# Patient Record
Sex: Male | Born: 1965 | Hispanic: Yes | Marital: Married | Smoking: Never smoker
Health system: Southern US, Community
[De-identification: ages and names within clinical notes are randomized; demographics above are authoritative.]

---

## 2018-12-23 ENCOUNTER — Emergency Department
Admission: EM | Admit: 2018-12-23 | Discharge: 2018-12-23 | Disposition: A | Discharge status: Home / Self Care | Payer: HRSA Program | Attending: Emergency Medicine | Admitting: Emergency Medicine

## 2018-12-23 ENCOUNTER — Other Ambulatory Visit: Payer: Self-pay

## 2018-12-23 ENCOUNTER — Emergency Department: Payer: HRSA Program

## 2018-12-23 DIAGNOSIS — R0602 Shortness of breath: Secondary | ICD-10-CM | POA: Diagnosis present

## 2018-12-23 DIAGNOSIS — U071 COVID-19: Secondary | ICD-10-CM | POA: Insufficient documentation

## 2018-12-23 LAB — SARS CORONAVIRUS 2 BY RT PCR (HOSPITAL ORDER, PERFORMED IN ~~LOC~~ HOSPITAL LAB): SARS Coronavirus 2: POSITIVE — AB

## 2018-12-23 LAB — CBC
HCT: 45.7 % (ref 39.0–52.0)
Hemoglobin: 16 g/dL (ref 13.0–17.0)
MCH: 30.9 pg (ref 26.0–34.0)
MCHC: 35 g/dL (ref 30.0–36.0)
MCV: 88.4 fL (ref 80.0–100.0)
Platelets: 160 10*3/uL (ref 150–400)
RBC: 5.17 MIL/uL (ref 4.22–5.81)
RDW: 11.9 % (ref 11.5–15.5)
WBC: 6.8 10*3/uL (ref 4.0–10.5)
nRBC: 0 % (ref 0.0–0.2)

## 2018-12-23 LAB — COMPREHENSIVE METABOLIC PANEL
ALT: 35 U/L (ref 0–44)
AST: 40 U/L (ref 15–41)
Albumin: 3.2 g/dL — ABNORMAL LOW (ref 3.5–5.0)
Alkaline Phosphatase: 51 U/L (ref 38–126)
Anion gap: 10 (ref 5–15)
BUN: 12 mg/dL (ref 6–20)
CO2: 26 mmol/L (ref 22–32)
Calcium: 8.1 mg/dL — ABNORMAL LOW (ref 8.9–10.3)
Chloride: 98 mmol/L (ref 98–111)
Creatinine, Ser: 0.57 mg/dL — ABNORMAL LOW (ref 0.61–1.24)
GFR calc Af Amer: >60 mL/min (ref >60)
GFR calc non Af Amer: >60 mL/min (ref >60)
Glucose, Bld: 219 mg/dL — ABNORMAL HIGH (ref 70–99)
Potassium: 3.7 mmol/L (ref 3.5–5.1)
Sodium: 134 mmol/L — ABNORMAL LOW (ref 135–145)
Total Bilirubin: 0.4 mg/dL (ref 0.3–1.2)
Total Protein: 6.6 g/dL (ref 6.5–8.1)

## 2018-12-23 LAB — TROPONIN I (HIGH SENSITIVITY): Troponin I (High Sensitivity): 5 ng/L (ref <18)

## 2018-12-23 MED ORDER — ACETAMINOPHEN 500 MG PO TABS
1000.0000 mg | ORAL_TABLET | Freq: Once | ORAL | Status: AC
  Administered 2018-12-23: 1000 mg via ORAL

## 2018-12-23 MED ORDER — ACETAMINOPHEN 500 MG PO TABS
ORAL_TABLET | ORAL | Status: AC
  Filled 2018-12-23: qty 2

## 2018-12-23 MED ORDER — HYDROCOD POLST-CPM POLST ER 10-8 MG/5ML PO SUER: 5.0000 mL | Freq: Two times a day (BID) | ORAL | 0 refills | Status: DC

## 2018-12-23 MED ORDER — SODIUM CHLORIDE 0.9 % IV BOLUS
1000.0000 mL | Freq: Once | INTRAVENOUS | Status: AC
  Administered 2018-12-23: 14:00:00 1000 mL via INTRAVENOUS

## 2018-12-23 NOTE — ED Notes (Signed)
Placed on 2L [place] 95% at this time. Interpreter at bedside for triage.

## 2018-12-23 NOTE — ED Provider Notes (Signed)
[facility] Emergency Department Provider Note  Time seen: 2:36 PM  I have reviewed the triage vital signs and the nursing notes.   HISTORY  Chief Complaint Chest Pain, Fever, Shortness of Breath, and Cough   HPI Clements Toro is a 53 y.o. male with no significant past medical history presents to the emergency department for shortness of breath cough chest pain and fever.  According to the patient last week his wife was sick with similar symptoms however after approximately 4-5 days she recovered.  Approximately 8 days ago the patient developed sore throat cough followed by fever and shortness of breath.  Patient states his symptoms worsen so he came to the emergency department for evaluation.  Patient states chest tightness but states this is mild.  Denies any abdominal pain, vomiting or diarrhea.   Patient's wife was never tested for coronavirus.  History reviewed. No pertinent past medical history.  There are no active problems to display for this patient.   History reviewed. No pertinent surgical history.  Prior to Admission medications   Not on File    No Known Allergies  No family history on file.  Social History Social History   Tobacco Use  . Smoking status: Never Smoker  Substance Use Topics  . Alcohol use: Not Currently  . Drug use: Not on file    Review of Systems Constitutional: Positive for fever ENT: Positive for sore throat Cardiovascular: Positive for chest tightness Respiratory: Positive for shortness of breath.  Positive for cough. Gastrointestinal: Negative for abdominal pain Genitourinary: Negative for urinary compaints Musculoskeletal: Negative for musculoskeletal complaints Skin: Negative for skin complaints  Neurological: Negative for headache All other ROS negative  ____________________________________________   PHYSICAL EXAM:  VITAL SIGNS: ED Triage Vitals [12/23/18 1330]  Enc Vitals Group     BP (!) 167/94      Pulse Rate 95     Resp (!) 28     Temp (!) 103.1 F (39.5 C)     Temp Source Oral     SpO2 91 %     Weight 165 lb (74.8 kg)     Height 5\' 10"  (1.778 m)     Head Circumference      Peak Flow      Pain Score 10     Pain Loc      Pain Edu?      Excl. in [place]?    Constitutional: Alert and oriented. Well appearing and in no distress. Eyes: Normal exam ENT      Head: Normocephalic and atraumatic.      Mouth/Throat: Mucous membranes are moist. Cardiovascular: Normal rate, regular rhythm.  Respiratory: Normal respiratory effort without tachypnea nor retractions. Breath sounds are clear  Gastrointestinal: Soft and nontender. No distention.   Musculoskeletal: Nontender with normal range of motion in all extremities.  Neurologic:  Normal speech and language. No gross focal neurologic deficits  Skin:  Skin is warm, dry and intact.  Psychiatric: Mood and affect are normal.   ____________________________________________   RADIOLOGY  Chest x-ray shows streaky bilateral airspace opacities.  Likely multifocal pneumonia  EKG EKG viewed and interpreted by myself shows a normal sinus rhythm at 94 bpm with a narrow QRS, normal axis, normal intervals, no concerning ST changes.  ____________________________________________   INITIAL IMPRESSION / ASSESSMENT AND PLAN / ED COURSE  Pertinent labs & imaging results that were available during my care of the patient were reviewed by me and considered in my medical decision making (  see chart for details).   Patient presents emergency department for cough shortness of breath and fever.  Wife was sick with similar symptoms little over 1 week ago.  He denies any other known sick contacts and states they isolated away from each other in the home.  Differential would include coronavirus, pneumonia, upper respiratory infection, bronchitis.  Chest x-ray consistent with multifocal pneumonia, coronavirus test pending.  Graciella BeltonGregorio Pizzi was evaluated in  Emergency Department on 12/23/2018 for the symptoms described in the history of present illness. He was evaluated in the context of the global COVID-19 pandemic, which necessitated consideration that the patient might be at risk for infection with the SARS-CoV-2 virus that causes COVID-19. Institutional protocols and algorithms that pertain to the evaluation of patients at risk for COVID-19 are in a state of rapid change based on information released by regulatory bodies including the CDC and federal and state organizations. These policies and algorithms were followed during the patient's care in the ED.  ____________________________________________   FINAL CLINICAL IMPRESSION(S) / ED Kayleen MemosIAGNOSES     Lizbet Cirrincione, MD 12/25/18 [phone]

## 2018-12-23 NOTE — ED Notes (Signed)
Report given to Amy C. RN

## 2018-12-23 NOTE — ED Notes (Signed)
md at bedside.  Iv in place.  Pt alert.  Pt has a cough.  nsr on monitor.  Pt reports sob.  No chest pain now.

## 2018-12-23 NOTE — ED Triage Notes (Addendum)
Fever, cough, SOB and chest pain x 7 days.

## 2018-12-23 NOTE — ED Provider Notes (Signed)
Patient is coronavirus positive, oxygen saturation appears to be 92% off of oxygen and he is feeling better.  He is cleared for outpatient follow-up.   Earleen Newport, MD 12/23/18 [phone]

## 2018-12-23 NOTE — ED Notes (Signed)
md in with pt again  D/c inst to pt.   Pt alert.

## 2018-12-23 NOTE — ED Notes (Signed)
Resumed care from sonja rn.  Pt sitting up on stretcher.

## 2018-12-27 ENCOUNTER — Inpatient Hospital Stay (HOSPITAL_COMMUNITY)
Admission: AD | Admit: 2018-12-27 | Discharge: 2019-01-02 | DRG: 177 | Disposition: A | Discharge status: Home / Self Care | Payer: HRSA Program | Attending: Family Medicine | Admitting: Family Medicine

## 2018-12-27 ENCOUNTER — Emergency Department: Payer: HRSA Program

## 2018-12-27 ENCOUNTER — Emergency Department
Admission: EM | Admit: 2018-12-27 | Discharge: 2018-12-27 | Disposition: A | Discharge status: Other Acute Inpatient Hospital | Payer: HRSA Program | Attending: Emergency Medicine | Admitting: Emergency Medicine

## 2018-12-27 ENCOUNTER — Encounter: Payer: Self-pay | Admitting: Emergency Medicine

## 2018-12-27 ENCOUNTER — Other Ambulatory Visit: Payer: Self-pay

## 2018-12-27 DIAGNOSIS — U071 COVID-19: Secondary | ICD-10-CM

## 2018-12-27 DIAGNOSIS — E785 Hyperlipidemia, unspecified: Secondary | ICD-10-CM | POA: Diagnosis present

## 2018-12-27 DIAGNOSIS — R739 Hyperglycemia, unspecified: Secondary | ICD-10-CM | POA: Diagnosis present

## 2018-12-27 DIAGNOSIS — E871 Hypo-osmolality and hyponatremia: Secondary | ICD-10-CM

## 2018-12-27 DIAGNOSIS — J9601 Acute respiratory failure with hypoxia: Secondary | ICD-10-CM | POA: Diagnosis present

## 2018-12-27 DIAGNOSIS — J1289 Other viral pneumonia: Secondary | ICD-10-CM | POA: Diagnosis present

## 2018-12-27 DIAGNOSIS — E1165 Type 2 diabetes mellitus with hyperglycemia: Secondary | ICD-10-CM | POA: Diagnosis not present

## 2018-12-27 DIAGNOSIS — E119 Type 2 diabetes mellitus without complications: Secondary | ICD-10-CM | POA: Diagnosis not present

## 2018-12-27 DIAGNOSIS — Z79899 Other long term (current) drug therapy: Secondary | ICD-10-CM

## 2018-12-27 DIAGNOSIS — R0602 Shortness of breath: Secondary | ICD-10-CM | POA: Diagnosis present

## 2018-12-27 DIAGNOSIS — E118 Type 2 diabetes mellitus with unspecified complications: Secondary | ICD-10-CM | POA: Diagnosis not present

## 2018-12-27 DIAGNOSIS — J069 Acute upper respiratory infection, unspecified: Secondary | ICD-10-CM | POA: Diagnosis not present

## 2018-12-27 DIAGNOSIS — Z87891 Personal history of nicotine dependence: Secondary | ICD-10-CM | POA: Diagnosis not present

## 2018-12-27 DIAGNOSIS — E861 Hypovolemia: Secondary | ICD-10-CM | POA: Diagnosis present

## 2018-12-27 DIAGNOSIS — R740 Nonspecific elevation of levels of transaminase and lactic acid dehydrogenase [LDH]: Secondary | ICD-10-CM | POA: Diagnosis present

## 2018-12-27 DIAGNOSIS — R0902 Hypoxemia: Secondary | ICD-10-CM

## 2018-12-27 DIAGNOSIS — Z9981 Dependence on supplemental oxygen: Secondary | ICD-10-CM

## 2018-12-27 DIAGNOSIS — R0603 Acute respiratory distress: Secondary | ICD-10-CM | POA: Diagnosis not present

## 2018-12-27 DIAGNOSIS — IMO0002 Reserved for concepts with insufficient information to code with codable children: Secondary | ICD-10-CM | POA: Diagnosis present

## 2018-12-27 LAB — URINALYSIS, COMPLETE (UACMP) WITH MICROSCOPIC
Bacteria, UA: NONE SEEN
Bilirubin Urine: NEGATIVE
Glucose, UA: >=500 mg/dL — AB
Hgb urine dipstick: NEGATIVE
Ketones, ur: 20 mg/dL — AB
Nitrite: NEGATIVE
Protein, ur: 30 mg/dL — AB
Specific Gravity, Urine: 1.009 (ref 1.005–1.030)
pH: 7 (ref 5.0–8.0)

## 2018-12-27 LAB — CBC WITH DIFFERENTIAL/PLATELET
Abs Immature Granulocytes: 0.17 10*3/uL — ABNORMAL HIGH (ref 0.00–0.07)
Basophils Absolute: 0 10*3/uL (ref 0.0–0.1)
Basophils Relative: 0 %
Eosinophils Absolute: 0 10*3/uL (ref 0.0–0.5)
Eosinophils Relative: 0 %
HCT: 46.7 % (ref 39.0–52.0)
Hemoglobin: 16.4 g/dL (ref 13.0–17.0)
Immature Granulocytes: 1 %
Lymphocytes Relative: 9 %
Lymphs Abs: 1.1 10*3/uL (ref 0.7–4.0)
MCH: 30.8 pg (ref 26.0–34.0)
MCHC: 35.1 g/dL (ref 30.0–36.0)
MCV: 87.6 fL (ref 80.0–100.0)
Monocytes Absolute: 0.8 10*3/uL (ref 0.1–1.0)
Monocytes Relative: 7 %
Neutro Abs: 9.7 10*3/uL — ABNORMAL HIGH (ref 1.7–7.7)
Neutrophils Relative %: 83 %
Platelets: 266 10*3/uL (ref 150–400)
RBC: 5.33 MIL/uL (ref 4.22–5.81)
RDW: 11.9 % (ref 11.5–15.5)
WBC: 11.8 10*3/uL — ABNORMAL HIGH (ref 4.0–10.5)
nRBC: 0 % (ref 0.0–0.2)

## 2018-12-27 LAB — PROCALCITONIN: Procalcitonin: 0.44 ng/mL

## 2018-12-27 LAB — COMPREHENSIVE METABOLIC PANEL
ALT: 42 U/L (ref 0–44)
AST: 51 U/L — ABNORMAL HIGH (ref 15–41)
Albumin: 3 g/dL — ABNORMAL LOW (ref 3.5–5.0)
Alkaline Phosphatase: 62 U/L (ref 38–126)
Anion gap: 14 (ref 5–15)
BUN: 11 mg/dL (ref 6–20)
CO2: 24 mmol/L (ref 22–32)
Calcium: 8.2 mg/dL — ABNORMAL LOW (ref 8.9–10.3)
Chloride: 92 mmol/L — ABNORMAL LOW (ref 98–111)
Creatinine, Ser: 0.54 mg/dL — ABNORMAL LOW (ref 0.61–1.24)
GFR calc Af Amer: >60 mL/min (ref >60)
GFR calc non Af Amer: >60 mL/min (ref >60)
Glucose, Bld: 200 mg/dL — ABNORMAL HIGH (ref 70–99)
Potassium: 3.8 mmol/L (ref 3.5–5.1)
Sodium: 130 mmol/L — ABNORMAL LOW (ref 135–145)
Total Bilirubin: 1 mg/dL (ref 0.3–1.2)
Total Protein: 6.9 g/dL (ref 6.5–8.1)

## 2018-12-27 LAB — FERRITIN: Ferritin: 1720 ng/mL — ABNORMAL HIGH (ref 24–336)

## 2018-12-27 LAB — LACTIC ACID, PLASMA: Lactic Acid, Venous: 1.5 mmol/L (ref 0.5–1.9)

## 2018-12-27 LAB — GLUCOSE, CAPILLARY
Glucose-Capillary: 229 mg/dL — ABNORMAL HIGH (ref 70–99)
Glucose-Capillary: 217 mg/dL — ABNORMAL HIGH (ref 70–99)

## 2018-12-27 LAB — BRAIN NATRIURETIC PEPTIDE: B Natriuretic Peptide: 52 pg/mL (ref 0.0–100.0)

## 2018-12-27 LAB — TROPONIN I (HIGH SENSITIVITY)
Troponin I (High Sensitivity): 5 ng/L (ref <18)
Troponin I (High Sensitivity): 4 ng/L (ref <18)

## 2018-12-27 LAB — C-REACTIVE PROTEIN: CRP: 23.3 mg/dL — ABNORMAL HIGH (ref <1.0)

## 2018-12-27 LAB — FIBRIN DERIVATIVES D-DIMER (ARMC ONLY): Fibrin derivatives D-dimer (ARMC): 813.96 ng/mL (FEU) — ABNORMAL HIGH (ref 0.00–499.00)

## 2018-12-27 MED ORDER — ONDANSETRON HCL 4 MG/2ML IJ SOLN: 4.0000 mg | Freq: Four times a day (QID) | INTRAMUSCULAR | Status: DC | PRN

## 2018-12-27 MED ORDER — INSULIN ASPART 100 UNIT/ML ~~LOC~~ SOLN
0.0000 [IU] | SUBCUTANEOUS | Status: DC
  Administered 2018-12-27 – 2018-12-28 (×5): 5 [IU] via SUBCUTANEOUS
  Administered 2018-12-28: 11 [IU] via SUBCUTANEOUS
  Administered 2018-12-28: 08:00:00 3 [IU] via SUBCUTANEOUS
  Administered 2018-12-28: 21:00:00 8 [IU] via SUBCUTANEOUS
  Administered 2018-12-28 – 2018-12-29 (×3): 3 [IU] via SUBCUTANEOUS
  Administered 2018-12-29: 13:00:00 5 [IU] via SUBCUTANEOUS

## 2018-12-27 MED ORDER — SODIUM CHLORIDE 0.9 % IV SOLN
INTRAVENOUS | Status: DC
  Administered 2018-12-27: 18:00:00 75 mL/h via INTRAVENOUS

## 2018-12-27 MED ORDER — SODIUM CHLORIDE 0.9 % IV SOLN
1.0000 g | Freq: Once | INTRAVENOUS | Status: AC
  Administered 2018-12-27: 10:00:00 1 g via INTRAVENOUS
  Filled 2018-12-27: qty 10

## 2018-12-27 MED ORDER — ONDANSETRON HCL 4 MG PO TABS: 4.0000 mg | ORAL_TABLET | Freq: Four times a day (QID) | ORAL | Status: DC | PRN

## 2018-12-27 MED ORDER — SODIUM CHLORIDE 0.9 % IV SOLN
500.0000 mg | Freq: Once | INTRAVENOUS | Status: AC
  Administered 2018-12-27: 10:00:00 500 mg via INTRAVENOUS
  Filled 2018-12-27: qty 500

## 2018-12-27 MED ORDER — SODIUM CHLORIDE 0.9% FLUSH
3.0000 mL | Freq: Two times a day (BID) | INTRAVENOUS | Status: DC
  Administered 2018-12-27 – 2019-01-02 (×11): 3 mL via INTRAVENOUS

## 2018-12-27 MED ORDER — DOCUSATE SODIUM 100 MG PO CAPS
100.0000 mg | ORAL_CAPSULE | Freq: Two times a day (BID) | ORAL | Status: DC
  Administered 2018-12-27 – 2019-01-02 (×11): 100 mg via ORAL
  Filled 2018-12-27 (×12): qty 1

## 2018-12-27 MED ORDER — FAMOTIDINE 20 MG PO TABS
20.0000 mg | ORAL_TABLET | Freq: Two times a day (BID) | ORAL | Status: DC
  Administered 2018-12-27 – 2019-01-02 (×12): 20 mg via ORAL
  Filled 2018-12-27 (×12): qty 1

## 2018-12-27 MED ORDER — METHYLPREDNISOLONE SODIUM SUCC 125 MG IJ SOLR
125.0000 mg | Freq: Once | INTRAMUSCULAR | Status: AC
  Administered 2018-12-27: 125 mg via INTRAVENOUS
  Filled 2018-12-27: qty 2

## 2018-12-27 MED ORDER — SODIUM CHLORIDE 0.9% FLUSH: 3.0000 mL | INTRAVENOUS | Status: DC | PRN

## 2018-12-27 MED ORDER — VITAMIN C 500 MG PO TABS
500.0000 mg | ORAL_TABLET | Freq: Every day | ORAL | Status: DC
  Administered 2018-12-27 – 2019-01-02 (×7): 500 mg via ORAL
  Filled 2018-12-27 (×8): qty 1

## 2018-12-27 MED ORDER — SODIUM CHLORIDE 0.9 % IV SOLN
100.0000 mg | INTRAVENOUS | Status: AC
  Administered 2018-12-28 – 2018-12-31 (×4): 100 mg via INTRAVENOUS
  Filled 2018-12-27 (×4): qty 20

## 2018-12-27 MED ORDER — GUAIFENESIN-DM 100-10 MG/5ML PO SYRP
10.0000 mL | ORAL_SOLUTION | ORAL | Status: DC | PRN
  Administered 2018-12-31: 16:00:00 10 mL via ORAL
  Filled 2018-12-27: qty 10

## 2018-12-27 MED ORDER — SODIUM CHLORIDE 0.9 % IV SOLN: 250.0000 mL | INTRAVENOUS | Status: DC | PRN

## 2018-12-27 MED ORDER — HYDROCOD POLST-CPM POLST ER 10-8 MG/5ML PO SUER
5.0000 mL | Freq: Two times a day (BID) | ORAL | Status: DC | PRN
  Administered 2018-12-30 – 2018-12-31 (×2): 5 mL via ORAL
  Filled 2018-12-27 (×2): qty 5

## 2018-12-27 MED ORDER — ENOXAPARIN SODIUM 40 MG/0.4ML ~~LOC~~ SOLN
40.0000 mg | Freq: Two times a day (BID) | SUBCUTANEOUS | Status: DC
  Administered 2018-12-27 – 2019-01-01 (×10): 40 mg via SUBCUTANEOUS
  Filled 2018-12-27 (×9): qty 0.4

## 2018-12-27 MED ORDER — SODIUM CHLORIDE 0.9 % IV SOLN
200.0000 mg | Freq: Once | INTRAVENOUS | Status: AC
  Administered 2018-12-27: 20:00:00 200 mg via INTRAVENOUS
  Filled 2018-12-27: qty 40

## 2018-12-27 MED ORDER — ORAL CARE MOUTH RINSE
15.0000 mL | Freq: Two times a day (BID) | OROMUCOSAL | Status: DC
  Administered 2018-12-27 – 2019-01-02 (×9): 15 mL via OROMUCOSAL

## 2018-12-27 MED ORDER — METHYLPREDNISOLONE SODIUM SUCC 40 MG IJ SOLR
40.0000 mg | Freq: Two times a day (BID) | INTRAMUSCULAR | Status: DC
  Administered 2018-12-27 – 2019-01-01 (×10): 40 mg via INTRAVENOUS
  Filled 2018-12-27 (×9): qty 1

## 2018-12-27 MED ORDER — ACETAMINOPHEN 325 MG PO TABS: 650.0000 mg | ORAL_TABLET | Freq: Four times a day (QID) | ORAL | Status: DC | PRN

## 2018-12-27 MED ORDER — ZINC SULFATE 220 (50 ZN) MG PO CAPS
220.0000 mg | ORAL_CAPSULE | Freq: Every day | ORAL | Status: DC
  Administered 2018-12-27 – 2019-01-02 (×7): 220 mg via ORAL
  Filled 2018-12-27 (×7): qty 1

## 2018-12-27 MED ORDER — TOCILIZUMAB 400 MG/20ML IV SOLN
8.0000 mg/kg | Freq: Once | INTRAVENOUS | Status: AC
  Administered 2018-12-27: 598 mg via INTRAVENOUS
  Filled 2018-12-27: qty 29.9

## 2018-12-27 MED ORDER — IPRATROPIUM-ALBUTEROL 20-100 MCG/ACT IN AERS
1.0000 | INHALATION_SPRAY | Freq: Four times a day (QID) | RESPIRATORY_TRACT | Status: DC
  Administered 2018-12-27 – 2018-12-29 (×6): 1 via RESPIRATORY_TRACT
  Filled 2018-12-27: qty 4

## 2018-12-27 MED ORDER — CHLORHEXIDINE GLUCONATE CLOTH 2 % EX PADS
6.0000 | MEDICATED_PAD | Freq: Every day | CUTANEOUS | Status: DC
  Administered 2018-12-28 – 2019-01-01 (×5): 6 via TOPICAL

## 2018-12-27 MED ORDER — SODIUM CHLORIDE 0.9 % IV SOLN
25.0000 mg | Freq: Three times a day (TID) | INTRAVENOUS | Status: DC | PRN
  Administered 2018-12-27 – 2018-12-28 (×3): 25 mg via INTRAVENOUS
  Filled 2018-12-27 (×3): qty 1

## 2018-12-27 NOTE — ED Provider Notes (Signed)
[facility] Emergency Department Provider Note   ____________________________________________   First MD Initiated Contact with Patient 12/27/18 [phone]     (approximate)  I have reviewed the triage vital signs and the nursing notes.   HISTORY  Chief Complaint Respiratory Distress History limited by shortness of breath Chief complaint shortness of breath HPI Chad Mcclain is a 53 y.o. male who is known COVID positive.  He has been getting more short of breath for the last few days.  He has a low-grade fever.  And triage his sats were in the 56s.  He was brought straight back.  He is extremely short of breath before he is put on oxygen.        History reviewed. No pertinent past medical history.  There are no active problems to display for this patient.   History reviewed. No pertinent surgical history.  Prior to Admission medications   Medication Sig Start Date End Date Taking? Authorizing Provider  chlorpheniramine-HYDROcodone (TUSSIONEX PENNKINETIC ER) 10-8 MG/5ML SUER Take 5 mLs by mouth 2 (two) times daily. 12/23/18   Earleen Newport, MD    Allergies Patient has no known allergies.  No family history on file.  Social History Social History   Tobacco Use  . Smoking status: Never Smoker  . Smokeless tobacco: Never Used  Substance Use Topics  . Alcohol use: Not Currently  . Drug use: Not on file    Review of Systems  Unable to get complete review of systems due to shortness of breath  ____________________________________________   PHYSICAL EXAM:  VITAL SIGNS: ED Triage Vitals [12/27/18 0845]  Enc Vitals Group     BP      Pulse Rate 82     Resp (!) 28     Temp 99.6 F (37.6 C)     Temp Source Oral     SpO2 (!) 85 %     Weight      Height      Head Circumference      Peak Flow      Pain Score 0     Pain Loc      Pain Edu?      Excl. in [place]?     Constitutional: Alert and oriented.  In respiratory distress Eyes:  Conjunctivae are normal.  Head: Atraumatic. Nose: No congestion/rhinnorhea. Mouth/Throat: Mucous membranes are moist.  Oropharynx non-erythematous. Neck: No stridor.   Cardiovascular: Normal rate, regular rhythm. Grossly normal heart sounds.  Good peripheral circulation. Respiratory: Increased respiratory effort.   retractions. Lungs CTAB. Gastrointestinal: Soft and nontender. No distention. No abdominal bruits. No CVA tenderness. Musculoskeletal: No lower extremity tenderness nor edema   Neurologic:  Normal speech and language. No gross focal neurologic deficits are appreciated.  Skin:  Skin is warm, dry and intact. No rash noted.   ____________________________________________   LABS (all labs ordered are listed, but only abnormal results are displayed)  Labs Reviewed  COMPREHENSIVE METABOLIC PANEL - Abnormal; Notable for the following components:      Result Value   Sodium 130 (*)    Chloride 92 (*)    Glucose, Bld 200 (*)    Creatinine, Ser 0.54 (*)    Calcium 8.2 (*)    Albumin 3.0 (*)    AST 51 (*)    All other components within normal limits  CBC WITH DIFFERENTIAL/PLATELET - Abnormal; Notable for the following components:   WBC 11.8 (*)    Neutro Abs 9.7 (*)  Abs Immature Granulocytes 0.17 (*)    All other components within normal limits  CULTURE, BLOOD (ROUTINE X 2)  CULTURE, BLOOD (ROUTINE X 2)  URINE CULTURE  BRAIN NATRIURETIC PEPTIDE  LACTIC ACID, PLASMA  LACTIC ACID, PLASMA  URINALYSIS, COMPLETE (UACMP) WITH MICROSCOPIC  TROPONIN I (HIGH SENSITIVITY)   ____________________________________________  EKG   ____________________________________________  RADIOLOGY  ED MD interpretation:    Official radiology report(s): Dg Chest Portable 1 View  Result Date: 12/27/2018 CLINICAL DATA:  COVID-19 positive.  Shortness of breath. EXAM: PORTABLE CHEST 1 VIEW COMPARISON:  12/23/2018 FINDINGS: Again noted are low lung volumes. Persistent streaky lung densities  with slight progression, particularly at the right lung base. Heart size is stable and within normal limits. The trachea is midline. Negative for pneumothorax. IMPRESSION: Mild progression of the bilateral lung densities particularly at the right lung base. Findings are suggestive for bilateral pneumonia. Electronically Signed   By: Richarda OverlieAdam  Henn M.D.   On: 12/27/2018 09:09    ____________________________________________   PROCEDURES  Procedure(s) performed (including Critical Care):  Procedures   ____________________________________________   INITIAL IMPRESSION / ASSESSMENT AND PLAN / ED COURSE Patient was coronavirus positive on the sixth of this month.  He is now getting increasing shortness of breath and worsening chest x-ray.  Troponin is negative BNP is negative lactic acid is negative white count is minimally elevated.  I will give him some antibiotics for regular pneumonia just in case but we will see if we can get [facility].  .[facility] but [facility] are [facility] yet.  He is stable on his nonrebreather.  O2 sats about 95 to 96%.  We have ordered the labs and given the medications requested by [facility].  He will be transferred to since they have a room over there.             ____________________________________________   FINAL CLINICAL IMPRESSION(S) / ED DIAGNOSES  Final diagnoses:  COVID-19 virus infection  Respiratory distress  Hypoxia     ED Discharge Orders    None       Note:  This document was prepared using Dragon voice recognition software and may include unintentional dictation errors.    Arnaldo NatalMalinda, [name], MD 12/27/18 [phone]

## 2018-12-27 NOTE — H&P (Addendum)
[facility] History and Physical  Chad Mcclain URK:[id number] DOB: December 17, 1965 DOA: 12/27/2018   PCP: Patient, No Pcp Per  Specialists: None  Chief Complaint: Shortness of breath  HPI: Chad Mcclain is a 53 y.o. male with no known past medical history who presented with complains of shortness of breath and cough.  Initially presented to the emergency department at [facility] on 7/6 complaining of fever, cough, shortness of breath ongoing for many days.  Patient was tested for COVID-19 and was found to be positive.  However at that time patient was not hypoxic.  He was discharged home.  He presented back to the emergency department this morning with complaints of worsening shortness of breath.  Denies orthopnea or symptoms suggestive of PND.  Patient mentions a cough with whitish expectoration.  Denies any chest pain.  No nausea vomiting.  No diarrhea.  He has had body aches.  No known sick contacts.    In the emergency department he was noted to be hypoxic saturating in the 60s.  Oxygen was immediately applied.  He improved.  He was given steroids.  However patient continued to have oxygen requirements and felt short of breath.  He was changed over to 100% nonrebreather.  Chest x-ray showed bilateral opacities.  Patient was hospitalized for further management.  Home Medications: Prior to Admission medications   Medication Sig Start Date End Date Taking? Authorizing Provider  chlorpheniramine-HYDROcodone (TUSSIONEX PENNKINETIC ER) 10-8 MG/5ML SUER Take 5 mLs by mouth 2 (two) times daily. 12/23/18   Chad Newport, MD    Allergies: No Known Allergies  Past Medical History: Denies any past medical history.  No history of heart disease.  Social History: He is a former smoker.  Denies any alcohol use.  No illicit drug use.   Family History: Denies any health problems in his family members.  However his parents are deceased.  Review of Systems - History obtained from the  patient General ROS: positive for  - fatigue and fever Psychological ROS: negative Ophthalmic ROS: negative ENT ROS: negative Allergy and Immunology ROS: negative Hematological and Lymphatic ROS: negative Endocrine ROS: negative Respiratory ROS: As in HPI Cardiovascular ROS: As in HPI Gastrointestinal ROS: no abdominal pain, change in bowel habits, or black or bloody stools Genito-Urinary ROS: no dysuria, trouble voiding, or hematuria Musculoskeletal ROS: negative Neurological ROS: no TIA or stroke symptoms Dermatological ROS: negative  Physical Examination  Vitals:   12/27/18 1617 12/27/18 1618  BP:  (!) 141/89  Pulse: 75 74  Resp: (!) 21 (!) 35  TempSrc:  Oral  SpO2: 92% 92%    BP (!) 141/89 (BP Location: Left Arm)   Pulse 74   Resp (!) 35   SpO2 92%   General appearance: alert, cooperative, appears stated age and no distress Head: Normocephalic, without obvious abnormality, atraumatic Eyes: conjunctivae/corneas clear. PERRL, EOM's intact. Throat: lips, mucosa, and tongue normal; teeth and gums normal Neck: no adenopathy, no carotid bruit, no JVD, supple, symmetrical, trachea midline and thyroid not enlarged, symmetric, no tenderness/mass/nodules Resp: Tachypneic at rest.  No use of accessory muscles yet.  Crackles bilaterally right more than left.  No wheezing or rhonchi. Cardio: regular rate and rhythm, S1, S2 normal, no murmur, click, rub or gallop GI: soft, non-tender; bowel sounds normal; no masses,  no organomegaly Extremities: extremities normal, atraumatic, no cyanosis or edema Pulses: 2+ and symmetric Skin: Skin color, texture, turgor normal. No rashes or lesions Lymph nodes: Cervical, supraclavicular, and axillary nodes normal. Neurologic: Alert and  oriented x3.  Cranial nerves II to XII intact.  No focal motor neurological deficits.    Labs on Admission: I have personally reviewed following labs and imaging studies  CBC: Recent Labs  Lab 12/23/18 1405  12/27/18 0856  WBC 6.8 11.8*  NEUTROABS  --  9.7*  HGB 16.0 16.4  HCT 45.7 46.7  MCV 88.4 87.6  PLT 160 266   Basic Metabolic Panel: Recent Labs  Lab 12/23/18 1405 12/27/18 0856  NA 134* 130*  K 3.7 3.8  CL 98 92*  CO2 26 24  GLUCOSE 219* 200*  BUN 12 11  CREATININE 0.57* 0.54*  CALCIUM 8.1* 8.2*   GFR: Estimated Creatinine Clearance: 107.7 mL/min (A) (by C-G formula based on SCr of 0.54 mg/dL (L)). Liver Function Tests: Recent Labs  Lab 12/23/18 1405 12/27/18 0856  AST 40 51*  ALT 35 42  ALKPHOS 51 62  BILITOT 0.4 1.0  PROT 6.6 6.9  ALBUMIN 3.2* 3.0*   Anemia Panel: Recent Labs    12/27/18 0856  FERRITIN 1,720*     Radiological Exams on Admission: Dg Chest Portable 1 View  Result Date: 12/27/2018 CLINICAL DATA:  COVID-19 positive.  Shortness of breath. EXAM: PORTABLE CHEST 1 VIEW COMPARISON:  12/23/2018 FINDINGS: Again noted are low lung volumes. Persistent streaky lung densities with slight progression, particularly at the right lung base. Heart size is stable and within normal limits. The trachea is midline. Negative for pneumothorax. IMPRESSION: Mild progression of the bilateral lung densities particularly at the right lung base. Findings are suggestive for bilateral pneumonia. Electronically Signed   By: Richarda OverlieAdam  Henn M.D.   On: 12/27/2018 09:09    My interpretation of Electrocardiogram: EKG from 7/6 shows sinus rhythm.  Left axis deviation.  Nonspecific T wave changes.  No concerning ST changes.  Intervals appear to be normal.  QT interval was normal.   Problem List  Active Problems:   Acute respiratory disease due to COVID-19 virus   Acute respiratory failure with hypoxia (HCC)   Hyperglycemia   Hyponatremia   Assessment: This is a 53 year old male of Hispanic origin who speaks very limited English presented with many day history of shortness of breath and cough.  He was found to be positive for COVID-19 on 7/6 but at that time he was not hypoxic.   Today he presented with worsening shortness of breath and has acute respiratory failure with hypoxia.  This is all secondary to COVID-19.  Plan:  Acute respiratory failure with hypoxia/pneumonia secondary to COVID-19  COVID-19 Labs  Recent Labs    12/27/18 0856  FERRITIN 1,720*  CRP 23.3*    Lab Results  Component Value Date   SARSCOV2NAA POSITIVE (A) 12/23/2018     Fever: Noted to be afebrile today.  He did have a temperature of 103.1 on 7/6. Oxygen requirements: Currently on high flow nasal cannula at 15 L/min.  Saturating in the 90s Antibiotics: He was given ceftriaxone and azithromycin in the [place] emergency department Remdesivir: This will be initiated today Steroids: He was given Solu-Medrol in the emergency department.  He will be started on 40 mg twice daily Diuretics: Not yet Actemra: He was given 1 dose earlier today at [facility]. Convalescent Plasma: Not yet Vitamin C and Zinc: Will be initiated DVT Prophylaxis:  Lovenox 40 mg subcutaneously twice daily  Patient requiring about 15 L of oxygen by high flow nasal cannula.  Chest x-ray does show bilateral opacities.  His inflammatory markers elevated with a CRP  of 23.3 and a ferritin of 1720.  Patient has been given steroids and Actemra.  He will be started on Remdesivir as well.  He has normal renal function.  His LFTs are close to normal.  He will be monitored in the intensive care unit for now.  Prone positioning was encouraged.  Incentive spirometry and mobilization.  We will continue to trend inflammatory markers and d-dimer.  Patient's BNP is normal.  Procalcitonin was noted to be 0.45.  Patient was given ceftriaxone and [facility]ithromycin at [facility].  We will recheck procalcitonin levels tomorrow.  WBC is noted to be mildly elevated 11.8.  Decide on continuing antibiotics depending on these values and clinical status tomorrow morning.  The treatment plan and use of medications and known side effects were  discussed with patient.  Some of the medications used are based on case reports/anecdotal data which are not peer-reviewed and has not been studied using randomized control trials.  Complete risks and long-term side effects are unknown, however in the best clinical judgment they seem to be of some benefit.  Patient agrees with the treatment plan and want to receive these treatments as indicated.  He was given Actemra in [facility].  He will be placed on steroids.  Hyponatremia Possibly due to hypovolemia.  We will recheck labs tomorrow.  Could consider gentle hydration for the next 12 hours.  Hyperglycemia Patient without any known history of diabetes.  His glucose level is 200 from blood work done this morning.  We will check HbA1c.  Monitor his CBGs as glucose levels will most likely rise due to steroids.  Place him on SSI for now.  It is quite possible that he has undiagnosed diabetes.  Hiccups Will use Thorazine as needed.  DVT Prophylaxis: Lovenox Code Status: Full code Family Communication: Discussed with the patient Disposition: Intensive care unit Consults called: Pulmonology Admission Status: Inpatient  Severity of Illness: The appropriate patient status for this patient is INPATIENT. Inpatient status is judged to be reasonable and necessary in order to provide the required intensity of service to ensure the patient's safety. The patient's presenting symptoms, physical exam findings, and initial radiographic and laboratory data in the context of their chronic comorbidities is felt to place them at high risk for further clinical deterioration. Furthermore, it is not anticipated that the patient will be medically stable for discharge from the hospital within 2 midnights of admission. The following factors support the patient status of inpatient.   " The patient's presenting symptoms include shortness of breath and cough. " The worrisome physical exam findings  include tachypnea, hypoxia. " The initial radiographic and laboratory data are worrisome because of bilateral lung opacities. " The chronic co-morbidities include former smoker.   * I certify that at the point of admission it is my clinical judgment that the patient will require inpatient hospital care spanning beyond 2 midnights from the point of admission due to high intensity of service, high risk for further deterioration and high frequency of surveillance required.*  Further management decisions will depend on results of further testing and patient's response to treatment.   Carlson Belland OmnicareKrishnan  [facility] Pager on [email or web address]  12/27/2018, 5:28 PM

## 2018-12-27 NOTE — ED Notes (Addendum)
Pt ambulated to bathroom without assistance, while in the bathroom pt's O2 dropped to 74%, placed back on 15L NRB, O2 back to 96% at this time

## 2018-12-27 NOTE — ED Notes (Signed)
[facility]  CALLED  FOR  TRANSFER PER  DR  Cinda Quest  MD

## 2018-12-27 NOTE — Progress Notes (Signed)
Patient able to self prone at this time.

## 2018-12-27 NOTE — ED Triage Notes (Signed)
sats 61% RA at check in. Labored. Tachypnea.

## 2018-12-27 NOTE — ED Triage Notes (Signed)
PT here for [facility], Cough, COVID +.

## 2018-12-27 NOTE — ED Notes (Signed)
Pt lying in bed with O2 in place in NAD, A&Ox4.

## 2018-12-27 NOTE — Consult Note (Signed)
NAME:  Chad Mcclain, MRN:  [id number], DOB:  Mar 25, 1966, LOS: 0 ADMISSION DATE:  12/27/2018, CONSULTATION DATE:  12/27/2018 REFERRING MD:  Rito EhrlichKrishnan, CHIEF COMPLAINT:  Dyspnea, cough   Brief History   53 y/o male with minimal past medical history admitted on 7/10 with severe acute respiratory failure with hypoxemia in the setting of COVID 19 pneumonia.   History of present illness   This is a pleasant 53 y/o male with no known past medical history who came to our facility on 7/10 complaining of 5 days of cough, dyspnea and fever.  He says that he started feeling poorly on 7/5 with fever, pain all over, and cough.  His cough has been productive of clear white mucus.  He says that the cough persisted for 3-4 days with associated body and bone aches.  He finally got tested July 9 and was found to be positive.  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  He thinks that perhaps he caught his illness from individuals he works with who suffer from addiction and want to recover.  He says that by Thursday his fever broke but he started feeling more short of breath and his cough worsened.    In the ER he received solumedrol, actemra and antibiotics.   He also complains of frequent hiccoughs. This has caused some pain/tenderness in the left side of his abdomen.    Past Medical History  none  Significant Hospital Events   7/10 admision  Consults:  PCCM  Procedures:  none  Significant Diagnostic Tests:    Micro Data:  7/6 SARS COV2 Positive 7/10 blood culture >  7/10 urine culture >   Antimicrobials/COVID Rx:  7/10 Azithro>  7/10 Ceftriaxone >  7/10 solumedrol  7/10 actemra    Interim history/subjective:    Objective   Blood pressure (!) 141/89, pulse 74, resp. rate (!) 35, SpO2 92 %.       No intake or output data in the 24 hours ending 12/27/18 1654 There were no vitals filed for this visit.  Examination:   General:  Resting comfortably in bed HENT: NCAT OP clear PULM: Crackles bases B, normal effort CV: RRR, no mgr GI: BS+, soft, nontender MSK: normal bulk and tone Neuro: awake, alert, no distress, MAEW   Resolved Hospital Problem list     Assessment & Plan:  Acute respiratory failure with hypoxemia due to COVID 19 pneumonia Admit to ICU for close monitoring as he is at increased risk of decompensation Start remdesivir Continue solumedrol 0.5-1.0mg /kg/day IV in divided doses Repeat ferritin, CRP, LDH in AM to assess whether or not he may need Actemra again Administer high flow nasal cannula (Salter device) Tolerate periods of hypoxemia, goal at rest is greater than 85% SaO2, with movement ideally above 75% Decision for intubation should be based on a change in mental status or physical evidence of ventilatory failure such as nasal flaring, accessory muscle use, paradoxical breathing Out of bed to chair as able Prone positioning while in bed  Hyperglycemia, underlying diabetes?  SSI Check Hgb A1c  Best practice:  Diet: regular diet Pain/Anxiety/Delirium protocol (if indicated): n/a VAP protocol (if indicated): n/a DVT prophylaxis: lovenox per ICU protocol for COVID patients GI prophylaxis: n/a Glucose control: as above Mobility: out of bed as tolerated Code Status: full Family Communication: will update daughter Disposition: remain in ICU  Labs   CBC: Recent Labs  Lab 12/23/18 1405  12/27/18 0856  WBC 6.8 11.8*  NEUTROABS  --  9.7*  HGB 16.0 16.4  HCT 45.7 46.7  MCV 88.4 87.6  PLT 160 017    Basic Metabolic Panel: Recent Labs  Lab 12/23/18 1405 12/27/18 0856  NA 134* 130*  K 3.7 3.8  CL 98 92*  CO2 26 24  GLUCOSE 219* 200*  BUN 12 11  CREATININE 0.57* 0.54*  CALCIUM 8.1* 8.2*   GFR: Estimated Creatinine Clearance: 107.7 mL/min (A) (by C-G formula based on SCr of 0.54 mg/dL (L)). Recent Labs  Lab 12/23/18 1405 12/27/18 0856  PROCALCITON  --  0.44   WBC 6.8 11.8*  LATICACIDVEN  --  1.5    Liver Function Tests: Recent Labs  Lab 12/23/18 1405 12/27/18 0856  AST 40 51*  ALT 35 42  ALKPHOS 51 62  BILITOT 0.4 1.0  PROT 6.6 6.9  ALBUMIN 3.2* 3.0*   No results for input(s): LIPASE, AMYLASE in the last 168 hours. No results for input(s): AMMONIA in the last 168 hours.  ABG No results found for: PHART, PCO2ART, PO2ART, HCO3, TCO2, ACIDBASEDEF, O2SAT   Coagulation Profile: No results for input(s): INR, PROTIME in the last 168 hours.  Cardiac Enzymes: No results for input(s): CKTOTAL, CKMB, CKMBINDEX, TROPONINI in the last 168 hours.  HbA1C: No results found for: HGBA1C  CBG: No results for input(s): GLUCAP in the last 168 hours.  Review of Systems:   Gen: per HPI HEENT: Denies blurred vision, double vision, hearing loss, tinnitus, sinus congestion, rhinorrhea, sore throat, neck stiffness, dysphagia PULM: per HPI CV: Denies chest pain, edema, orthopnea, paroxysmal nocturnal dyspnea, palpitations GI: Denies abdominal pain, nausea, vomiting, diarrhea, hematochezia, melena, constipation, change in bowel habits GU: Denies dysuria, hematuria, polyuria, oliguria, urethral discharge Endocrine: Denies hot or cold intolerance, polyuria, polyphagia or appetite change Derm: Denies rash, dry skin, scaling or peeling skin change Heme: Denies easy bruising, bleeding, bleeding gums Neuro: Denies headache, numbness, weakness, slurred speech, loss of memory or consciousness   Past Medical History  He,  has no past medical history on file.   Surgical History   No past surgical history on file.   Social History   reports that he has never smoked. He has never used smokeless tobacco. He reports previous alcohol use.   Family History   His family history is not on file.   Allergies No Known Allergies   Home Medications  Prior to Admission medications   Medication Sig Start Date End Date Taking? Authorizing Provider   chlorpheniramine-HYDROcodone (TUSSIONEX PENNKINETIC ER) 10-8 MG/5ML SUER Take 5 mLs by mouth 2 (two) times daily. 12/23/18   Earleen Newport, MD     Critical care time: 35 minutes     Roselie Awkward, MD [place] Pager: [phone] Cell: [phone] If no response, call [phone]

## 2018-12-27 NOTE — Progress Notes (Signed)
Pharmacy Brief Note  O:  ALT: 42 CXR: consistent with bilateral PNA SpO2: 95 % on 15 L HFNC  A/P:  Patient meets criteria for remdesevir therapy. Will initiate remdesivir 200 mg iv once followed by 100 mg iv daily x 4 days. Will f/u ALT.   Napoleon Form, [facility] 12/27/18 5:20 PM

## 2018-12-28 LAB — FERRITIN: Ferritin: 1353 ng/mL — ABNORMAL HIGH (ref 24–336)

## 2018-12-28 LAB — CBC WITH DIFFERENTIAL/PLATELET
Abs Immature Granulocytes: 0.14 10*3/uL — ABNORMAL HIGH (ref 0.00–0.07)
Basophils Absolute: 0 10*3/uL (ref 0.0–0.1)
Basophils Relative: 0 %
Eosinophils Absolute: 0 10*3/uL (ref 0.0–0.5)
Eosinophils Relative: 0 %
HCT: 45.4 % (ref 39.0–52.0)
Hemoglobin: 15.6 g/dL (ref 13.0–17.0)
Immature Granulocytes: 2 %
Lymphocytes Relative: 11 %
Lymphs Abs: 1 10*3/uL (ref 0.7–4.0)
MCH: 31.4 pg (ref 26.0–34.0)
MCHC: 34.4 g/dL (ref 30.0–36.0)
MCV: 91.3 fL (ref 80.0–100.0)
Monocytes Absolute: 0.7 10*3/uL (ref 0.1–1.0)
Monocytes Relative: 8 %
Neutro Abs: 7.7 10*3/uL (ref 1.7–7.7)
Neutrophils Relative %: 79 %
Platelets: 309 10*3/uL (ref 150–400)
RBC: 4.97 MIL/uL (ref 4.22–5.81)
RDW: 12.3 % (ref 11.5–15.5)
WBC: 9.6 10*3/uL (ref 4.0–10.5)
nRBC: 0 % (ref 0.0–0.2)

## 2018-12-28 LAB — COMPREHENSIVE METABOLIC PANEL
ALT: 35 U/L (ref 0–44)
AST: 33 U/L (ref 15–41)
Albumin: 2.4 g/dL — ABNORMAL LOW (ref 3.5–5.0)
Alkaline Phosphatase: 50 U/L (ref 38–126)
Anion gap: 14 (ref 5–15)
BUN: 20 mg/dL (ref 6–20)
CO2: 25 mmol/L (ref 22–32)
Calcium: 8 mg/dL — ABNORMAL LOW (ref 8.9–10.3)
Chloride: 101 mmol/L (ref 98–111)
Creatinine, Ser: 0.61 mg/dL (ref 0.61–1.24)
GFR calc Af Amer: >60 mL/min (ref >60)
GFR calc non Af Amer: >60 mL/min (ref >60)
Glucose, Bld: 184 mg/dL — ABNORMAL HIGH (ref 70–99)
Potassium: 3.5 mmol/L (ref 3.5–5.1)
Sodium: 140 mmol/L (ref 135–145)
Total Bilirubin: 0.5 mg/dL (ref 0.3–1.2)
Total Protein: 5.8 g/dL — ABNORMAL LOW (ref 6.5–8.1)

## 2018-12-28 LAB — MAGNESIUM: Magnesium: 2 mg/dL (ref 1.7–2.4)

## 2018-12-28 LAB — GLUCOSE, CAPILLARY
Glucose-Capillary: 185 mg/dL — ABNORMAL HIGH (ref 70–99)
Glucose-Capillary: 185 mg/dL — ABNORMAL HIGH (ref 70–99)
Glucose-Capillary: 219 mg/dL — ABNORMAL HIGH (ref 70–99)
Glucose-Capillary: 234 mg/dL — ABNORMAL HIGH (ref 70–99)
Glucose-Capillary: 249 mg/dL — ABNORMAL HIGH (ref 70–99)
Glucose-Capillary: 284 mg/dL — ABNORMAL HIGH (ref 70–99)
Glucose-Capillary: 316 mg/dL — ABNORMAL HIGH (ref 70–99)

## 2018-12-28 LAB — ABO/RH: ABO/RH(D): B POS

## 2018-12-28 LAB — HEMOGLOBIN A1C
Hgb A1c MFr Bld: 11.3 % — ABNORMAL HIGH (ref 4.8–5.6)
Mean Plasma Glucose: 277.61 mg/dL

## 2018-12-28 LAB — PROCALCITONIN: Procalcitonin: 0.31 ng/mL

## 2018-12-28 LAB — D-DIMER, QUANTITATIVE: D-Dimer, Quant: 0.61 ug/mL-FEU — ABNORMAL HIGH (ref 0.00–0.50)

## 2018-12-28 LAB — C-REACTIVE PROTEIN: CRP: 17.3 mg/dL — ABNORMAL HIGH (ref <1.0)

## 2018-12-28 MED ORDER — TOCILIZUMAB 400 MG/20ML IV SOLN
600.0000 mg | Freq: Once | INTRAVENOUS | Status: AC
  Administered 2018-12-28: 13:00:00 600 mg via INTRAVENOUS
  Filled 2018-12-28: qty 20

## 2018-12-28 MED ORDER — POTASSIUM CHLORIDE CRYS ER 20 MEQ PO TBCR
40.0000 meq | EXTENDED_RELEASE_TABLET | Freq: Once | ORAL | Status: AC
  Administered 2018-12-28: 12:00:00 40 meq via ORAL
  Filled 2018-12-28: qty 2

## 2018-12-28 NOTE — Progress Notes (Addendum)
PROGRESS NOTE  Chad Mcclain ZOX:[id number] DOB: 09-28-1965 DOA: 12/27/2018  PCP: Patient, No Pcp Per  Brief History/Interval Summary: 53 year old male of Hispanic origin who speaks limited English with no significant past medical history who presented with shortness of breath cough.  Was noted to be hypoxic.  Found to be positive for COVID-19.  Was hospitalized to the intensive care unit.  Reason for Visit: Acute respiratory disease due to COVID-19  Consultants: Pulmonology  Procedures: None yet  Antibiotics: Anti-infectives (From admission, onward)   Start     Dose/Rate Route Frequency Ordered Stop   12/28/18 1830  remdesivir 100 mg in sodium chloride 0.9 % 250 mL IVPB     100 mg 500 mL/hr over 30 Minutes Intravenous Every 24 hours 12/27/18 1719 01/01/19 1829   12/27/18 1830  remdesivir 200 mg in sodium chloride 0.9 % 250 mL IVPB     200 mg 500 mL/hr over 30 Minutes Intravenous Once 12/27/18 1719 12/27/18 2058       Subjective/Interval History: Dr. Kendrick Mcclain was able to interpret.  Patient says that he is feeling slightly better.  Slept okay overnight.  Continues to have a dry cough.  Continues to have some shortness of breath.  No nausea vomiting.  Reports improved appetite.    Assessment/Plan:  Acute Hypoxic Resp. Failure due to Acute Covid 19 Viral Illness  COVID-19 Labs  Recent Labs    12/27/18 0856 12/28/18 0600  DDIMER  --  0.61*  FERRITIN 1,720* 1,353*  CRP 23.3* 17.3*    Lab Results  Component Value Date   SARSCOV2NAA POSITIVE (A) 12/23/2018     Fever: Fever in the last 24 hours Oxygen requirements: Currently on high flow nasal cannula at 15 L/min.  Saturating in the late 80s. Antibiotics: Patient was given ceftriaxone and azithromycin in the ED yesterday.  Not continued. Remdesivir: Day 2 Steroids: Solu-Medrol 40 mg every 12 hours Diuretics: None yet Actemra: 1 dose [facility] 7[facility] at [facility].  Dose to be repeated today. Convalescent Plasma: Not  enrolled into the study yet Vitamin C and Zinc: Continue DVT Prophylaxis:  Lovenox 40 mg subcu every 12 hours  Patient's respiratory status is stable.  He feels slightly better today compared to yesterday.  He still requiring 15 L of oxygen by high flow nasal cannula.  His inflammatory markers have shown slight improvement with CRP down to 17.3.  Ferritin 1353.  D-dimer 0.61.  Continue Remdesivir and steroids.  He was given 1 dose of Actemra yesterday.  Another dose to be repeated today.  Continue to monitor and consider convalescent plasma if there is no significant improvement in the next 24 hours.  Procalcitonin 0.31.  Hold off on antibacterials.  Prone positioning as much as possible.  Incentive spirometry and mobilization as much as possible.  The treatment plan and use of medications and known side effects were discussed with patient.  Some of the medications used are based on case reports/anecdotal data which are not peer-reviewed and has not been studied using randomized control trials.  Complete risks and long-term side effects are unknown, however in the best clinical judgment they seem to be of some benefit.  Patient agrees with the treatment plan and want to receive these treatments as indicated.  Patient was prescribed steroids and Actemra.  Hyponatremia Possibly due to hypovolemia.  Improved this morning.  Okay to stop IV fluids.  We will give him potassium today.  Newly diagnosed diabetes mellitus type 2 with hyperglycemia HbA1c 11.3.  Patient has not previously been diagnosed with diabetes.  This will be a new diagnosis for him.  Continue to check CBGs.  He is only on SSI for now.  Depending on CBG trend can consider long-acting insulin.  Hiccups Improved with Thorazine.   DVT Prophylaxis: Lovenox PUD Prophylaxis: Pepcid Code Status: Full code Family Communication: Pulmonology to discuss with his family members Disposition Plan: Depending on his progress over the next few hours  he may be transferred to the floor later today.   Medications:  Scheduled: . Chlorhexidine Gluconate Cloth  6 each Topical Daily  . docusate sodium  100 mg Oral BID  . enoxaparin (LOVENOX) injection  40 mg Subcutaneous Q12H  . famotidine  20 mg Oral BID  . insulin aspart  0-15 Units Subcutaneous Q4H  . Ipratropium-Albuterol  1 puff Inhalation Q6H  . mouth rinse  15 mL Mouth Rinse BID  . methylPREDNISolone (SOLU-MEDROL) injection  40 mg Intravenous Q12H  . sodium chloride flush  3 mL Intravenous Q12H  . vitamin C  500 mg Oral Daily  . zinc sulfate  220 mg Oral Daily   Continuous: . sodium chloride    . chlorproMAZINE (THORAZINE) IV 25 mg (12/28/18 0933)  . remdesivir 100 mg in NS 250 mL     AVW:UJWJXB chloride, acetaminophen, chlorpheniramine-HYDROcodone, chlorproMAZINE (THORAZINE) IV, guaiFENesin-dextromethorphan, ondansetron **OR** ondansetron (ZOFRAN) IV, sodium chloride flush   Objective:  Vital Signs  Vitals:   12/28/18 0300 12/28/18 0400 12/28/18 0742 12/28/18 0800  BP: 122/66 127/72  122/73  Pulse: 65 62 71 65  Resp: (!) 25 (!) 25 (!) 30 12  Temp:  98.6 F (37 C) 98.1 F (36.7 C)   TempSrc:  Oral Oral   SpO2: (!) 88% 90% (!) 89% (!) 89%    Intake/Output Summary (Last 24 hours) at 12/28/2018 1055 Last data filed at 12/28/2018 0844 Gross per 24 hour  Intake 1753.5 ml  Output 900 ml  Net 853.5 ml   There were no vitals filed for this visit.  General appearance: Awake alert.  In no distress Resp: Mildly tachypneic at rest.  No use of accessory muscles.  Coarse breath sounds with crackles bilateral bases.  No wheezing or rhonchi. Cardio: S1-S2 is normal regular.  No S3-S4.  No rubs murmurs or bruit GI: Abdomen is soft.  Nontender nondistended.  Bowel sounds are present normal.  No masses organomegaly Extremities: No edema.  Full range of motion of lower extremities. Neurologic: Alert and oriented x3.  No focal neurological deficits.    Lab Results:  Data  Reviewed: I have personally reviewed following labs and imaging studies  CBC: Recent Labs  Lab 12/23/18 1405 12/27/18 0856 12/28/18 0600  WBC 6.8 11.8* 9.6  NEUTROABS  --  9.7* 7.7  HGB 16.0 16.4 15.6  HCT 45.7 46.7 45.4  MCV 88.4 87.6 91.3  PLT 160 266 147    Basic Metabolic Panel: Recent Labs  Lab 12/23/18 1405 12/27/18 0856 12/28/18 0600  NA 134* 130* 140  K 3.7 3.8 3.5  CL 98 92* 101  CO2 26 24 25   GLUCOSE 219* 200* 184*  BUN 12 11 20   CREATININE 0.57* 0.54* 0.61  CALCIUM 8.1* 8.2* 8.0*  MG  --   --  2.0    GFR: Estimated Creatinine Clearance: 107.7 mL/min (by C-G formula based on SCr of 0.61 mg/dL).  Liver Function Tests: Recent Labs  Lab 12/23/18 1405 12/27/18 0856 12/28/18 0600  AST 40 51* 33  ALT 35 42  35  ALKPHOS 51 62 50  BILITOT 0.4 1.0 0.5  PROT 6.6 6.9 5.8*  ALBUMIN 3.2* 3.0* 2.4*     HbA1C: Recent Labs    12/28/18 0600  HGBA1C 11.3*    CBG: Recent Labs  Lab 12/27/18 1759 12/27/18 2016 12/28/18 0006 12/28/18 0338 12/28/18 0737  GLUCAP 229* 217* 185* 219* 185*    Anemia Panel: Recent Labs    12/27/18 0856 12/28/18 0600  FERRITIN 1,720* 1,353*    Recent Results (from the past 240 hour(s))  SARS Coronavirus 2 (CEPHEID- Performed in [facility] hospital lab), Hosp Order     Status: Abnormal   Collection Time: 12/23/18  2:05 PM   Specimen: Nasopharyngeal Swab  Result Value Ref Range Status   SARS Coronavirus 2 POSITIVE (A) NEGATIVE Final    Comment: RESULT CALLED TO, READ BACK BY AND VERIFIED WITH: AMY COYEN @1508  12/23/18 MJU (NOTE) If result is NEGATIVE SARS-CoV-2 target nucleic acids are NOT DETECTED. The SARS-CoV-2 RNA is generally detectable in upper and lower  respiratory specimens during the acute phase of infection. The lowest  concentration of SARS-CoV-2 viral copies this assay can detect is 250  copies / mL. A negative result does not preclude SARS-CoV-2 infection  and should not be used as the sole basis for  treatment or other  patient management decisions.  A negative result may occur with  improper specimen collection / handling, submission of specimen other  than nasopharyngeal swab, presence of viral mutation(s) within the  areas targeted by this assay, and inadequate number of viral copies  (<250 copies / mL). A negative result must be combined with clinical  observations, patient history, and epidemiological information. If result is POSITIVE SARS-CoV-2 target nucleic acids are DETECTED. The SARS-C oV-2 RNA is generally detectable in upper and lower  respiratory specimens during the acute phase of infection.  Positive  results are indicative of active infection with SARS-CoV-2.  Clinical  correlation with patient history and other diagnostic information is  necessary to determine patient infection status.  Positive results do  not rule out bacterial infection or co-infection with other viruses. If result is PRESUMPTIVE POSTIVE SARS-CoV-2 nucleic acids MAY BE PRESENT.   A presumptive positive result was obtained on the submitted specimen  and confirmed on repeat testing.  While 2019 novel coronavirus  (SARS-CoV-2) nucleic acids may be present in the submitted sample  additional confirmatory testing may be necessary for epidemiological  and / or clinical management purposes  to differentiate between  SARS-CoV-2 and other Sarbecovirus currently known to infect humans.  If clinically indicated additional testing with an alternate test  methodology [phone]([id number]) is advised.  The SARS-CoV-2 RNA is generally  detectable in upper and lower respiratory specimens during the acute  phase of infection. The expected result is Negative. Fact Sheet for Patients:  BoilerBrush.com.cyhttps://www.fda.gov/media/136312/download Fact Sheet for Healthcare Providers: https://pope.com/https://www.fda.gov/media/136313/download This test is not yet approved or cleared by the [place] FDA and has been authorized for detection and/or  diagnosis of SARS-CoV-2 by FDA under an Emergency Use Authorization (EUA).  This EUA will remain in effect (meaning this test can be used) for the duration of the COVID-19 declaration under Section 564(b)(1) of the Act, 21 U.S.C. section 360bbb-3(b)(1), unless the authorization is terminated or revoked sooner. Performed at [facility], [address] [place], [place] [address]   Culture, blood (routine x 2)     Status: None (Preliminary result)   Collection Time: 12/27/18  8:56 AM   Specimen:  BLOOD  Result Value Ref Range Status   Specimen Description BLOOD RAC  Final   Special Requests   Final    BOTTLES DRAWN AEROBIC AND ANAEROBIC Blood Culture adequate volume   Culture   Final    NO GROWTH < 24 HOURS Performed at [facility], [address] [place], [place] [address]    Report Status PENDING  Incomplete  Culture, blood (routine x 2)     Status: None (Preliminary result)   Collection Time: 12/27/18  8:56 AM   Specimen: BLOOD  Result Value Ref Range Status   Specimen Description BLOOD LT WRIST  Final   Special Requests   Final    BOTTLES DRAWN AEROBIC AND ANAEROBIC Blood Culture adequate volume   Culture   Final    NO GROWTH < 24 HOURS Performed at [facility], [address] [place], [place] [address]    Report Status PENDING  Incomplete      Radiology Studies: Dg Chest Portable 1 View  Result Date: 12/27/2018 CLINICAL DATA:  COVID-19 positive.  Shortness of breath. EXAM: PORTABLE CHEST 1 VIEW COMPARISON:  12/23/2018 FINDINGS: Again noted are low lung volumes. Persistent streaky lung densities with slight progression, particularly at the right lung base. Heart size is stable and within normal limits. The trachea is midline. Negative for pneumothorax. IMPRESSION: Mild progression of the bilateral lung densities particularly at the right lung base. Findings are suggestive for bilateral pneumonia. Electronically Signed   By: Richarda OverlieAdam  Henn M.D.    On: 12/27/2018 09:09       LOS: 1 day   Chad Mcclain  [facility] Pager on [email or web address]  12/28/2018, 10:55 AM

## 2018-12-28 NOTE — Progress Notes (Signed)
NAME:  Chad Mcclain, MRN:  [id number], DOB:  1966/02/29, LOS: 1 ADMISSION DATE:  12/27/2018, CONSULTATION DATE:  7/10 REFERRING MD:  [place] Pink, CHIEF COMPLAINT:  Dyspnea, cough   Brief History   53 year old male with minimal past medical history admitted on July 10 with severe acute respiratory failure with hypoxemia in the setting of COVID-19 pneumonia.  Past Medical History  none  Significant Hospital Events   July 10 admission  Consults:  Pulmonary and critical care medicine  Procedures:  None  Significant Diagnostic Tests:    Micro Data:  7/6 SARS COV2 Positive 7/10 blood culture >  7/10 urine culture >   Antimicrobials/COVID Treatment:  7/10 Azithro>  7/10 Ceftriaxone >  7/10 solumedrol  7/10 actemra   Interim history/subjective:  Respiratory status improved, feels better today Oxygenation stable Says that sleeping on his stomach helped his breathing.  Objective   Blood pressure 127/72, pulse 71, temperature 98.1 F (36.7 C), temperature source Oral, resp. rate (!) 30, SpO2 (!) 89 %.        Intake/Output Summary (Last 24 hours) at 12/28/2018 0747 Last data filed at 12/28/2018 0400 Gross per 24 hour  Intake 1441.28 ml  Output 900 ml  Net 541.28 ml   There were no vitals filed for this visit.  Examination:  General:  Resting comfortably in bed HENT: NCAT OP clear PULM: Crackles bases B, normal effort CV: RRR, no mgr GI: BS+, soft, nontender MSK: normal bulk and tone Neuro: awake, alert, no distress, MAEW  July 10 chest x-ray images independently reviewed showing increasing bilateral airspace disease, boot-shaped heart  Resolved Hospital Problem list     Assessment & Plan:  Acute respiratory failure with hypoxemia due to COVID 19 pneumonia Ventilatory status stable Suspect he will have high oxygen needs for several days Continue remdesivir Repeat dose of Actemra today Continue Solu-Medrol Tolerate periods of hypoxemia, goal at rest is  greater than 85% SaO2, with movement ideally above 75% Decision for intubation should be based on a change in mental status or physical evidence of ventilatory failure such as nasal flaring, accessory muscle use, paradoxical breathing Out of bed to chair as able Prone positioning while in bed  Hyperglycemia, underlying diabetes?  F/u A1c  Best practice:  Diet: regular diet Pain/Anxiety/Delirium protocol (if indicated): na/ VAP protocol (if indicated): n/a DVT prophylaxis: lovenox GI prophylaxis: n/a Glucose control: monitor with labs Mobility: out of bed to chair Code Status: full Family Communication: I updated his daughter [name] at length Disposition: move to progressive care  Labs   CBC: Recent Labs  Lab 12/23/18 1405 12/27/18 0856 12/28/18 0600  WBC 6.8 11.8* 9.6  NEUTROABS  --  9.7* 7.7  HGB 16.0 16.4 15.6  HCT 45.7 46.7 45.4  MCV 88.4 87.6 91.3  PLT 160 266 751    Basic Metabolic Panel: Recent Labs  Lab 12/23/18 1405 12/27/18 0856  NA 134* 130*  K 3.7 3.8  CL 98 92*  CO2 26 24  GLUCOSE 219* 200*  BUN 12 11  CREATININE 0.57* 0.54*  CALCIUM 8.1* 8.2*   GFR: Estimated Creatinine Clearance: 107.7 mL/min (A) (by C-G formula based on SCr of 0.54 mg/dL (L)). Recent Labs  Lab 12/23/18 1405 12/27/18 0856 12/28/18 0600  PROCALCITON  --  0.44  --   WBC 6.8 11.8* 9.6  LATICACIDVEN  --  1.5  --     Liver Function Tests: Recent Labs  Lab 12/23/18 1405 12/27/18 0856  AST 40 51*  ALT 35  42  ALKPHOS 51 62  BILITOT 0.4 1.0  PROT 6.6 6.9  ALBUMIN 3.2* 3.0*   No results for input(s): LIPASE, AMYLASE in the last 168 hours. No results for input(s): AMMONIA in the last 168 hours.  ABG No results found for: PHART, PCO2ART, PO2ART, HCO3, TCO2, ACIDBASEDEF, O2SAT   Coagulation Profile: No results for input(s): INR, PROTIME in the last 168 hours.  Cardiac Enzymes: No results for input(s): CKTOTAL, CKMB, CKMBINDEX, TROPONINI in the last 168 hours.   HbA1C: No results found for: HGBA1C  CBG: Recent Labs  Lab 12/27/18 1759 12/27/18 2016 12/28/18 0006 12/28/18 0338  GLUCAP 229* 217* 185* 219*     Critical care time: n/a    Heber CarolinaBrent , MD [place] [facility] Pager: [phone] Cell: [phone] If no response, call [phone]

## 2018-12-29 ENCOUNTER — Encounter (HOSPITAL_COMMUNITY): Payer: Self-pay | Admitting: *Deleted

## 2018-12-29 DIAGNOSIS — U071 COVID-19: Principal | ICD-10-CM

## 2018-12-29 DIAGNOSIS — J069 Acute upper respiratory infection, unspecified: Secondary | ICD-10-CM

## 2018-12-29 DIAGNOSIS — J9601 Acute respiratory failure with hypoxia: Secondary | ICD-10-CM

## 2018-12-29 DIAGNOSIS — E1165 Type 2 diabetes mellitus with hyperglycemia: Secondary | ICD-10-CM

## 2018-12-29 LAB — COMPREHENSIVE METABOLIC PANEL
ALT: 35 U/L (ref 0–44)
AST: 29 U/L (ref 15–41)
Albumin: 2.5 g/dL — ABNORMAL LOW (ref 3.5–5.0)
Alkaline Phosphatase: 62 U/L (ref 38–126)
Anion gap: 10 (ref 5–15)
BUN: 19 mg/dL (ref 6–20)
CO2: 26 mmol/L (ref 22–32)
Calcium: 8.4 mg/dL — ABNORMAL LOW (ref 8.9–10.3)
Chloride: 104 mmol/L (ref 98–111)
Creatinine, Ser: 0.62 mg/dL (ref 0.61–1.24)
GFR calc Af Amer: >60 mL/min (ref >60)
GFR calc non Af Amer: >60 mL/min (ref >60)
Glucose, Bld: 180 mg/dL — ABNORMAL HIGH (ref 70–99)
Potassium: 4.1 mmol/L (ref 3.5–5.1)
Sodium: 140 mmol/L (ref 135–145)
Total Bilirubin: 0.6 mg/dL (ref 0.3–1.2)
Total Protein: 5.9 g/dL — ABNORMAL LOW (ref 6.5–8.1)

## 2018-12-29 LAB — HIV ANTIBODY (ROUTINE TESTING W REFLEX): HIV Screen 4th Generation wRfx: NONREACTIVE

## 2018-12-29 LAB — CBC WITH DIFFERENTIAL/PLATELET
Abs Immature Granulocytes: 0.41 10*3/uL — ABNORMAL HIGH (ref 0.00–0.07)
Basophils Absolute: 0.1 10*3/uL (ref 0.0–0.1)
Basophils Relative: 0 %
Eosinophils Absolute: 0 10*3/uL (ref 0.0–0.5)
Eosinophils Relative: 0 %
HCT: 48.4 % (ref 39.0–52.0)
Hemoglobin: 16 g/dL (ref 13.0–17.0)
Immature Granulocytes: 3 %
Lymphocytes Relative: 8 %
Lymphs Abs: 1.2 10*3/uL (ref 0.7–4.0)
MCH: 30.7 pg (ref 26.0–34.0)
MCHC: 33.1 g/dL (ref 30.0–36.0)
MCV: 92.9 fL (ref 80.0–100.0)
Monocytes Absolute: 0.9 10*3/uL (ref 0.1–1.0)
Monocytes Relative: 6 %
Neutro Abs: 12.6 10*3/uL — ABNORMAL HIGH (ref 1.7–7.7)
Neutrophils Relative %: 83 %
Platelets: 368 10*3/uL (ref 150–400)
RBC: 5.21 MIL/uL (ref 4.22–5.81)
RDW: 12.2 % (ref 11.5–15.5)
WBC: 15.1 10*3/uL — ABNORMAL HIGH (ref 4.0–10.5)
nRBC: 0 % (ref 0.0–0.2)

## 2018-12-29 LAB — D-DIMER, QUANTITATIVE: D-Dimer, Quant: 0.28 ug/mL-FEU (ref 0.00–0.50)

## 2018-12-29 LAB — GLUCOSE, CAPILLARY
Glucose-Capillary: 192 mg/dL — ABNORMAL HIGH (ref 70–99)
Glucose-Capillary: 191 mg/dL — ABNORMAL HIGH (ref 70–99)
Glucose-Capillary: 210 mg/dL — ABNORMAL HIGH (ref 70–99)
Glucose-Capillary: 264 mg/dL — ABNORMAL HIGH (ref 70–99)
Glucose-Capillary: 259 mg/dL — ABNORMAL HIGH (ref 70–99)

## 2018-12-29 LAB — C-REACTIVE PROTEIN: CRP: 8.8 mg/dL — ABNORMAL HIGH (ref <1.0)

## 2018-12-29 LAB — URINE CULTURE: Culture: <10000 — AB

## 2018-12-29 LAB — FERRITIN: Ferritin: 1175 ng/mL — ABNORMAL HIGH (ref 24–336)

## 2018-12-29 LAB — MAGNESIUM: Magnesium: 2.2 mg/dL (ref 1.7–2.4)

## 2018-12-29 LAB — PROCALCITONIN: Procalcitonin: 0.22 ng/mL

## 2018-12-29 MED ORDER — INSULIN ASPART 100 UNIT/ML ~~LOC~~ SOLN
4.0000 [IU] | Freq: Three times a day (TID) | SUBCUTANEOUS | Status: DC
  Administered 2018-12-29 – 2018-12-31 (×6): 4 [IU] via SUBCUTANEOUS

## 2018-12-29 MED ORDER — INSULIN ASPART 100 UNIT/ML ~~LOC~~ SOLN
0.0000 [IU] | Freq: Three times a day (TID) | SUBCUTANEOUS | Status: DC
  Administered 2018-12-29: 17:00:00 11 [IU] via SUBCUTANEOUS
  Administered 2018-12-30: 19:00:00 15 [IU] via SUBCUTANEOUS
  Administered 2018-12-30: 4 [IU] via SUBCUTANEOUS
  Administered 2018-12-30: 12:00:00 15 [IU] via SUBCUTANEOUS
  Administered 2018-12-31: 7 [IU] via SUBCUTANEOUS
  Administered 2018-12-31: 12:00:00 15 [IU] via SUBCUTANEOUS
  Administered 2018-12-31: 08:00:00 4 [IU] via SUBCUTANEOUS
  Administered 2019-01-01: 09:00:00 11 [IU] via SUBCUTANEOUS
  Administered 2019-01-01: 12:00:00 7 [IU] via SUBCUTANEOUS
  Administered 2019-01-01: 11 [IU] via SUBCUTANEOUS
  Administered 2019-01-02: 4 [IU] via SUBCUTANEOUS
  Administered 2019-01-02: 12:00:00 15 [IU] via SUBCUTANEOUS

## 2018-12-29 MED ORDER — IPRATROPIUM-ALBUTEROL 20-100 MCG/ACT IN AERS
1.0000 | INHALATION_SPRAY | Freq: Four times a day (QID) | RESPIRATORY_TRACT | Status: DC | PRN
  Filled 2018-12-29: qty 4

## 2018-12-29 MED ORDER — INSULIN ASPART 100 UNIT/ML ~~LOC~~ SOLN
0.0000 [IU] | Freq: Every day | SUBCUTANEOUS | Status: DC
  Administered 2018-12-29: 21:00:00 3 [IU] via SUBCUTANEOUS
  Administered 2018-12-30: 23:00:00 2 [IU] via SUBCUTANEOUS
  Administered 2018-12-31: 3 [IU] via SUBCUTANEOUS
  Administered 2019-01-01: 5 [IU] via SUBCUTANEOUS

## 2018-12-29 NOTE — Progress Notes (Signed)
PROGRESS NOTE  Chad Mcclain ZYS:[id number] DOB: 1966/01/28 DOA: 12/27/2018  PCP: Patient, No Pcp Per  Brief History/Interval Summary: 53 year old male of Hispanic origin who speaks limited English with no significant past medical history who presented with shortness of breath cough.  Was noted to be hypoxic.  Found to be positive for COVID-19.  Was hospitalized to the intensive care unit.  Reason for Visit: Acute respiratory disease due to COVID-19  Consultants: Pulmonology  Procedures: None yet  Antibiotics: Anti-infectives (From admission, onward)   Start     Dose/Rate Route Frequency Ordered Stop   12/28/18 1830  remdesivir 100 mg in sodium chloride 0.9 % 250 mL IVPB     100 mg 500 mL/hr over 30 Minutes Intravenous Every 24 hours 12/27/18 1719 01/01/19 1829   12/27/18 1830  remdesivir 200 mg in sodium chloride 0.9 % 250 mL IVPB     200 mg 500 mL/hr over 30 Minutes Intravenous Once 12/27/18 1719 12/27/18 2058       Subjective/Interval History: Dr. Lake Bells was able to interpret.  Patient states that he is feeling better.  He slept well overnight..  No chest pain nausea or vomiting.      Assessment/Plan:  Acute Hypoxic Resp. Failure due to Acute Covid 19 Viral Illness  COVID-19 Labs  Recent Labs    12/27/18 0856 12/28/18 0600 12/29/18 0540  DDIMER  --  0.61* 0.28  FERRITIN 1,720* 1,353* 1,175*  CRP 23.3* 17.3* 8.8*    Lab Results  Component Value Date   SARSCOV2NAA POSITIVE (A) 12/23/2018     Fever: Afebrile in the last 48 hours Oxygen requirements: On HFNC.  8 L/min.  Saturating in the late 80s to early 90s.   Antibiotics: Patient was given ceftriaxone and azithromycin in the ED.  Not continued. Remdesivir: Day 3 Steroids: Solu-Medrol 40 mg every 12 hours.  Start tapering from tomorrow if he continues to improve Diuretics: None yet Actemra: Given on 7/10 and 7/11 Convalescent Plasma: Has not required Vitamin C and Zinc: Continue DVT Prophylaxis:   Lovenox 40 mg subcu every 12 hours  Patient's respiratory status appears to be improving.  Patient feels better.  His oxygen requirements have improved.  His inflammatory markers have improved.  CRP is down to 8.8.  Ferritin 1175.  D-dimer 0.28.  Continue steroids Remdesivir.  He has received 2 doses of Actemra.  Start tapering steroids from tomorrow if he continues to improve.  Since he has improved there is no need to enroll him into the convalescent plasma study.  Procalcitonin 0.22.  Holding off on antibiotics.  Prone positioning as much as possible.  Incentive spirometry and mobilization.  Leukocytosis is due to steroids.  The treatment plan and use of medications and known side effects were discussed with patient.  Some of the medications used are based on case reports/anecdotal data which are not peer-reviewed and has not been studied using randomized control trials.  Complete risks and long-term side effects are unknown, however in the best clinical judgment they seem to be of some benefit.  Patient agrees with the treatment plan and want to receive these treatments as indicated.  Patient was prescribed steroids and Actemra.  Hyponatremia Possibly due to hypovolemia.  Now resolved.  Potassium is normal today.  Newly diagnosed diabetes mellitus type 2 with hyperglycemia HbA1c 11.3.  Patient has not previously been diagnosed with diabetes.  This will be a new diagnosis for him.  CBGs are reasonably well controlled.  CBG should improve as steroid  is tapered down.  Continue SSI for now.  Increase to resistant coverage.  Hiccups Improved with Thorazine.   DVT Prophylaxis: Lovenox PUD Prophylaxis: Pepcid Code Status: Full code Family Communication: Pulmonology to discuss with family. Disposition Plan: Okay for transfer to the floor today.   Medications:  Scheduled:  Chlorhexidine Gluconate Cloth  6 each Topical Daily   docusate sodium  100 mg Oral BID   enoxaparin (LOVENOX) injection   40 mg Subcutaneous Q12H   famotidine  20 mg Oral BID   insulin aspart  0-15 Units Subcutaneous Q4H   mouth rinse  15 mL Mouth Rinse BID   methylPREDNISolone (SOLU-MEDROL) injection  40 mg Intravenous Q12H   sodium chloride flush  3 mL Intravenous Q12H   vitamin C  500 mg Oral Daily   zinc sulfate  220 mg Oral Daily   Continuous:  sodium chloride     chlorproMAZINE (THORAZINE) IV Stopped (12/28/18 2104)   remdesivir 100 mg in NS 250 mL Stopped (12/29/18 0303)   ZOX:WRUEAVPRN:sodium chloride, acetaminophen, chlorpheniramine-HYDROcodone, chlorproMAZINE (THORAZINE) IV, guaiFENesin-dextromethorphan, Ipratropium-Albuterol, ondansetron **OR** ondansetron (ZOFRAN) IV, sodium chloride flush   Objective:  Vital Signs  Vitals:   12/29/18 0500 12/29/18 0600 12/29/18 0725 12/29/18 0800  BP:    123/79  Pulse: (!) 58 (!) 58  64  Resp: 15 (!) 25  (!) 38  Temp:    97.7 F (36.5 C)  TempSrc:    Oral  SpO2: 92% 92% 92% 93%    Intake/Output Summary (Last 24 hours) at 12/29/2018 1228 Last data filed at 12/29/2018 0900 Gross per 24 hour  Intake 390 ml  Output 1050 ml  Net -660 ml   There were no vitals filed for this visit.   General appearance: Awake alert.  In no distress Resp: Normal effort at rest.  Coarse breath sounds bilaterally with crackles at the bases.  No wheezing or rhonchi.   Cardio: S1-S2 is normal regular.  No S3-S4.  No rubs murmurs or bruit GI: Abdomen is soft.  Nontender nondistended.  Bowel sounds are present normal.  No masses organomegaly Extremities: No edema.  Full range of motion of lower extremities. Neurologic: Alert and oriented x3.  No focal neurological deficits.    Lab Results:  Data Reviewed: I have personally reviewed following labs and imaging studies  CBC: Recent Labs  Lab 12/23/18 1405 12/27/18 0856 12/28/18 0600 12/29/18 0540  WBC 6.8 11.8* 9.6 15.1*  NEUTROABS  --  9.7* 7.7 12.6*  HGB 16.0 16.4 15.6 16.0  HCT 45.7 46.7 45.4 48.4  MCV 88.4  87.6 91.3 92.9  PLT 160 266 309 368    Basic Metabolic Panel: Recent Labs  Lab 12/23/18 1405 12/27/18 0856 12/28/18 0600 12/29/18 0540  NA 134* 130* 140 140  K 3.7 3.8 3.5 4.1  CL 98 92* 101 104  CO2 26 24 25 26   GLUCOSE 219* 200* 184* 180*  BUN 12 11 20 19   CREATININE 0.57* 0.54* 0.61 0.62  CALCIUM 8.1* 8.2* 8.0* 8.4*  MG  --   --  2.0 2.2    GFR: Estimated Creatinine Clearance: 107.7 mL/min (by C-G formula based on SCr of 0.62 mg/dL).  Liver Function Tests: Recent Labs  Lab 12/23/18 1405 12/27/18 0856 12/28/18 0600 12/29/18 0540  AST 40 51* 33 29  ALT 35 42 35 35  ALKPHOS 51 62 50 62  BILITOT 0.4 1.0 0.5 0.6  PROT 6.6 6.9 5.8* 5.9*  ALBUMIN 3.2* 3.0* 2.4* 2.5*  HbA1C: Recent Labs    12/28/18 0600  HGBA1C 11.3*    CBG: Recent Labs  Lab 12/28/18 1701 12/28/18 2016 12/28/18 2332 12/29/18 0413 12/29/18 0747  GLUCAP 249* 284* 234* 192* 191*    Anemia Panel: Recent Labs    12/28/18 0600 12/29/18 0540  FERRITIN 1,353* 1,175*    Recent Results (from the past 240 hour(s))  SARS Coronavirus 2 (CEPHEID- Performed in [facility] hospital lab), Hosp Order     Status: Abnormal   Collection Time: 12/23/18  2:05 PM   Specimen: Nasopharyngeal Swab  Result Value Ref Range Status   SARS Coronavirus 2 POSITIVE (A) NEGATIVE Final    Comment: RESULT CALLED TO, READ BACK BY AND VERIFIED WITH: AMY COYEN @1508  12/23/18 MJU (NOTE) If result is NEGATIVE SARS-CoV-2 target nucleic acids are NOT DETECTED. The SARS-CoV-2 RNA is generally detectable in upper and lower  respiratory specimens during the acute phase of infection. The lowest  concentration of SARS-CoV-2 viral copies this assay can detect is 250  copies / mL. A negative result does not preclude SARS-CoV-2 infection  and should not be used as the sole basis for treatment or other  patient management decisions.  A negative result may occur with  improper specimen collection / handling, submission of  specimen other  than nasopharyngeal swab, presence of viral mutation(s) within the  areas targeted by this assay, and inadequate number of viral copies  (<250 copies / mL). A negative result must be combined with clinical  observations, patient history, and epidemiological information. If result is POSITIVE SARS-CoV-2 target nucleic acids are DETECTED. The SARS-C oV-2 RNA is generally detectable in upper and lower  respiratory specimens during the acute phase of infection.  Positive  results are indicative of active infection with SARS-CoV-2.  Clinical  correlation with patient history and other diagnostic information is  necessary to determine patient infection status.  Positive results do  not rule out bacterial infection or co-infection with other viruses. If result is PRESUMPTIVE POSTIVE SARS-CoV-2 nucleic acids MAY BE PRESENT.   A presumptive positive result was obtained on the submitted specimen  and confirmed on repeat testing.  While 2019 novel coronavirus  (SARS-CoV-2) nucleic acids may be present in the submitted sample  additional confirmatory testing may be necessary for epidemiological  and / or clinical management purposes  to differentiate between  SARS-CoV-2 and other Sarbecovirus currently known to infect humans.  If clinically indicated additional testing with an alternate test  methodology [phone] is advised.  The SARS-CoV-2 RNA is generally  detectable in upper and lower respiratory specimens during the acute  phase of infection. The expected result is Negative. Fact Sheet for Patients:  BoilerBrush.com.cyhttps://www.fda.gov/media/136312/download Fact Sheet for Healthcare Providers: https://pope.com/https://www.fda.gov/media/136313/download This test is not yet approved or cleared by the [place] FDA and has been authorized for detection and/or diagnosis of SARS-CoV-2 by FDA under an Emergency Use Authorization (EUA).  This EUA will remain in effect (meaning this test can be used) for the  duration of the COVID-19 declaration under Section 564(b)(1) of the Act, 21 U.S.C. section 360bbb-3(b)(1), unless the authorization is terminated or revoked sooner. Performed at [facility], [address] [place], [place] [address]   Culture, blood (routine x 2)     Status: None (Preliminary result)   Collection Time: 12/27/18  8:56 AM   Specimen: BLOOD  Result Value Ref Range Status   Specimen Description BLOOD RAC  Final   Special Requests   Final  BOTTLES DRAWN AEROBIC AND ANAEROBIC Blood Culture adequate volume   Culture   Final    NO GROWTH 2 DAYS Performed at [facility], [address] [place], [place] [address]    Report Status PENDING  Incomplete  Culture, blood (routine x 2)     Status: None (Preliminary result)   Collection Time: 12/27/18  8:56 AM   Specimen: BLOOD  Result Value Ref Range Status   Specimen Description BLOOD LT WRIST  Final   Special Requests   Final    BOTTLES DRAWN AEROBIC AND ANAEROBIC Blood Culture adequate volume   Culture   Final    NO GROWTH 2 DAYS Performed at [facility], [address] [place], [place] [address]    Report Status PENDING  Incomplete  Urine culture     Status: Abnormal   Collection Time: 12/27/18  3:09 PM   Specimen: Urine, Random  Result Value Ref Range Status   Specimen Description   Final    URINE, RANDOM Performed at [facility], [address] [place], [place] [address]    Special Requests   Final    NONE Performed at [facility], [address] [place], [place] [address]    Culture (A)  Final    <10,000 COLONIES/mL INSIGNIFICANT GROWTH Performed at [facility], [address] [place], [place] [address]    Report Status 12/29/2018 FINAL  Final      Radiology Studies: No results found.     LOS: 2 days   Navada Osterhout Rito EhrlichKrishnan  [facility] Pager on [email or web address]  12/29/2018, 12:28 PM

## 2018-12-29 NOTE — Progress Notes (Addendum)
Inpatient Diabetes Program Recommendations  AACE/ADA: New Consensus Statement on Inpatient Glycemic Control (2015)  Target Ranges:  Prepandial:   less than 140 mg/dL      Peak postprandial:   less than 180 mg/dL (1-2 hours)      Critically ill patients:  140 - 180 mg/dL   Results for COLTON, TASSIN (MRN [id number]) as of 12/29/2018 13:54  Ref. Range 12/28/2018 23:32 12/29/2018 04:13 12/29/2018 07:47 12/29/2018 12:27  Glucose-Capillary Latest Ref Range: 70 - 99 mg/dL 234 (H) 192 (H) 191 (H) 210 (H)   Results for CASSIAN, TORELLI (MRN [id number]) as of 12/29/2018 13:54  Ref. Range 12/28/2018 06:00  Hemoglobin A1C Latest Ref Range: 4.8 - 5.6 % 11.3 (H)  (277 mg/dl)     Admit with: COVID 19  History: None  Current Orders: Novolog Resistant Correction Scale/ SSI (0-20 units) TID AC + HS     Novolog 4 units TID with meals     Per MD notes, New Diagnosis of Diabetes this admission.  Current A1c= 11.3%.  Getting Solumedrol 40 mg BID.  Novolog SSI increased to the Resistant scale today  Novolog 4 units TID with meals (meal coverage) also to start at 5pm today with [employer]  DM Coordinator to follow up by phone with patient and translator about new diagnosis on Monday (07/13).  Will ask RNs working with patient to begin basic DM education and insulin education as well in case decision made to send pt home on insulin.  Will place care management consult for pt since he does NOT have PCP nor Insurance coverage.    --Will follow patient during hospitalization--  Wyn Quaker RN, MSN, CDE Diabetes Coordinator Inpatient Glycemic Control Team Team Pager: [phone] (8a-5p)

## 2018-12-29 NOTE — Progress Notes (Signed)
NAME:  Chad Mcclain, MRN:  [id number], DOB:  Oct 25, 1965, LOS: 2 ADMISSION DATE:  12/27/2018, CONSULTATION DATE:  7/10 REFERRING MD:  [place] Pink, CHIEF COMPLAINT:  Dyspnea, cough   Brief History   53 year old male with minimal past medical history admitted on July 10 with severe acute respiratory failure with hypoxemia in the setting of COVID-19 pneumonia.  Past Medical History  none  Significant Hospital Events   July 10 admission  Consults:  Pulmonary and critical care medicine  Procedures:  None  Significant Diagnostic Tests:    Micro Data:  7/6 SARS COV2 Positive 7/10 blood culture >  7/10 urine culture >   Antimicrobials/COVID Treatment:  7/10 Azithro>  7/10 Ceftriaxone >  7/10 solumedrol  7/10 actemra   Interim history/subjective:   Feeling better Minimal cough Hiccups improved  Objective   Blood pressure 120/77, pulse (!) 58, temperature (!) 97.5 F (36.4 C), temperature source Oral, resp. rate (!) 25, SpO2 92 %.        Intake/Output Summary (Last 24 hours) at 12/29/2018 0740 Last data filed at 12/29/2018 0200 Gross per 24 hour  Intake 462.22 ml  Output 1050 ml  Net -587.78 ml   There were no vitals filed for this visit.  Examination:  General:  Resting comfortably in bed HENT: NCAT OP clear PULM: CTA B, normal effort CV: RRR, no mgr GI: BS+, soft, nontender MSK: normal bulk and tone Neuro: awake, alert, no distress, MAEW   July 10 chest x-ray images independently reviewed showing increasing bilateral airspace disease, boot-shaped heart  Resolved Hospital Problem list     Assessment & Plan:  Acute respiratory failure with hypoxemia due to COVID 19 pneumonia: improving Oxygenation improving, continue to wean down supplemental oxygen to maintain O2 saturation greater than 85% Continue remdesivir Continue solumedrol Tolerate periods of hypoxemia, goal at rest is greater than 85% SaO2, with movement ideally above 75% Decision for  intubation should be based on a change in mental status or physical evidence of ventilatory failure such as nasal flaring, accessory muscle use, paradoxical breathing Out of bed to chair as able Prone positioning while in bed  Hyperglycemia, underlying diabetes F/u Hgb A1c    Best practice:  Diet: regular diet Pain/Anxiety/Delirium protocol (if indicated): n/a VAP protocol (if indicated): n/a DVT prophylaxis: lovenox GI prophylaxis: n/a Glucose control: monitor with labs Mobility: out of bed to chair Code Status: full Family Communication: I was able to call his daughter [name] and give her update Disposition: move to progressive care  Labs   CBC: Recent Labs  Lab 12/23/18 1405 12/27/18 0856 12/28/18 0600  WBC 6.8 11.8* 9.6  NEUTROABS  --  9.7* 7.7  HGB 16.0 16.4 15.6  HCT 45.7 46.7 45.4  MCV 88.4 87.6 91.3  PLT 160 266 106    Basic Metabolic Panel: Recent Labs  Lab 12/23/18 1405 12/27/18 0856 12/28/18 0600  NA 134* 130* 140  K 3.7 3.8 3.5  CL 98 92* 101  CO2 26 24 25   GLUCOSE 219* 200* 184*  BUN 12 11 20   CREATININE 0.57* 0.54* 0.61  CALCIUM 8.1* 8.2* 8.0*  MG  --   --  2.0   GFR: Estimated Creatinine Clearance: 107.7 mL/min (by C-G formula based on SCr of 0.61 mg/dL). Recent Labs  Lab 12/23/18 1405 12/27/18 0856 12/28/18 0600  PROCALCITON  --  0.44 0.31  WBC 6.8 11.8* 9.6  LATICACIDVEN  --  1.5  --     Liver Function Tests: Recent Labs  Lab  12/23/18 1405 12/27/18 0856 12/28/18 0600  AST 40 51* 33  ALT 35 42 35  ALKPHOS 51 62 50  BILITOT 0.4 1.0 0.5  PROT 6.6 6.9 5.8*  ALBUMIN 3.2* 3.0* 2.4*   No results for input(s): LIPASE, AMYLASE in the last 168 hours. No results for input(s): AMMONIA in the last 168 hours.  ABG No results found for: PHART, PCO2ART, PO2ART, HCO3, TCO2, ACIDBASEDEF, O2SAT   Coagulation Profile: No results for input(s): INR, PROTIME in the last 168 hours.  Cardiac Enzymes: No results for input(s): CKTOTAL, CKMB,  CKMBINDEX, TROPONINI in the last 168 hours.  HbA1C: Hgb A1c MFr Bld  Date/Time Value Ref Range Status  12/28/2018 06:00 AM 11.3 (H) 4.8 - 5.6 % Final    Comment:    (NOTE) Pre diabetes:          5.7%-6.4% Diabetes:              >6.4% Glycemic control for   <7.0% adults with diabetes     CBG: Recent Labs  Lab 12/28/18 1129 12/28/18 1701 12/28/18 2016 12/28/18 2332 12/29/18 0413  GLUCAP 316* 249* 284* 234* 192*     Critical care time: n/a    Heber CarolinaBrent Taleigha Pinson, MD [facility] Pager: [phone] Cell: [phone] If no response, call [phone]

## 2018-12-30 LAB — CBC WITH DIFFERENTIAL/PLATELET
Abs Immature Granulocytes: 0.36 10*3/uL — ABNORMAL HIGH (ref 0.00–0.07)
Basophils Absolute: 0 10*3/uL (ref 0.0–0.1)
Basophils Relative: 0 %
Eosinophils Absolute: 0 10*3/uL (ref 0.0–0.5)
Eosinophils Relative: 0 %
HCT: 49.3 % (ref 39.0–52.0)
Hemoglobin: 16.2 g/dL (ref 13.0–17.0)
Immature Granulocytes: 3 %
Lymphocytes Relative: 7 %
Lymphs Abs: 0.8 10*3/uL (ref 0.7–4.0)
MCH: 30.6 pg (ref 26.0–34.0)
MCHC: 32.9 g/dL (ref 30.0–36.0)
MCV: 93 fL (ref 80.0–100.0)
Monocytes Absolute: 0.6 10*3/uL (ref 0.1–1.0)
Monocytes Relative: 5 %
Neutro Abs: 10.7 10*3/uL — ABNORMAL HIGH (ref 1.7–7.7)
Neutrophils Relative %: 85 %
Platelets: 406 10*3/uL — ABNORMAL HIGH (ref 150–400)
RBC: 5.3 MIL/uL (ref 4.22–5.81)
RDW: 12.1 % (ref 11.5–15.5)
WBC: 12.5 10*3/uL — ABNORMAL HIGH (ref 4.0–10.5)
nRBC: 0 % (ref 0.0–0.2)

## 2018-12-30 LAB — COMPREHENSIVE METABOLIC PANEL
ALT: 54 U/L — ABNORMAL HIGH (ref 0–44)
AST: 49 U/L — ABNORMAL HIGH (ref 15–41)
Albumin: 2.6 g/dL — ABNORMAL LOW (ref 3.5–5.0)
Alkaline Phosphatase: 65 U/L (ref 38–126)
Anion gap: 10 (ref 5–15)
BUN: 20 mg/dL (ref 6–20)
CO2: 28 mmol/L (ref 22–32)
Calcium: 8.4 mg/dL — ABNORMAL LOW (ref 8.9–10.3)
Chloride: 100 mmol/L (ref 98–111)
Creatinine, Ser: 0.69 mg/dL (ref 0.61–1.24)
GFR calc Af Amer: >60 mL/min (ref >60)
GFR calc non Af Amer: >60 mL/min (ref >60)
Glucose, Bld: 231 mg/dL — ABNORMAL HIGH (ref 70–99)
Potassium: 4.6 mmol/L (ref 3.5–5.1)
Sodium: 138 mmol/L (ref 135–145)
Total Bilirubin: 0.4 mg/dL (ref 0.3–1.2)
Total Protein: 5.8 g/dL — ABNORMAL LOW (ref 6.5–8.1)

## 2018-12-30 LAB — GLUCOSE, CAPILLARY
Glucose-Capillary: 191 mg/dL — ABNORMAL HIGH (ref 70–99)
Glucose-Capillary: 315 mg/dL — ABNORMAL HIGH (ref 70–99)
Glucose-Capillary: 350 mg/dL — ABNORMAL HIGH (ref 70–99)
Glucose-Capillary: 236 mg/dL — ABNORMAL HIGH (ref 70–99)

## 2018-12-30 LAB — C-REACTIVE PROTEIN: CRP: 5.1 mg/dL — ABNORMAL HIGH (ref <1.0)

## 2018-12-30 LAB — D-DIMER, QUANTITATIVE: D-Dimer, Quant: 0.28 ug/mL-FEU (ref 0.00–0.50)

## 2018-12-30 LAB — FERRITIN: Ferritin: 1059 ng/mL — ABNORMAL HIGH (ref 24–336)

## 2018-12-30 LAB — MAGNESIUM: Magnesium: 1.9 mg/dL (ref 1.7–2.4)

## 2018-12-30 MED ORDER — LIVING WELL WITH DIABETES BOOK - IN SPANISH
Freq: Once | Status: AC
  Administered 2018-12-30: 10:00:00
  Filled 2018-12-30: qty 1

## 2018-12-30 MED ORDER — INSULIN GLARGINE 100 UNIT/ML ~~LOC~~ SOLN
10.0000 [IU] | Freq: Every day | SUBCUTANEOUS | Status: DC
  Administered 2018-12-30 – 2018-12-31 (×2): 10 [IU] via SUBCUTANEOUS
  Filled 2018-12-30 (×2): qty 0.1

## 2018-12-30 MED ORDER — IPRATROPIUM-ALBUTEROL 20-100 MCG/ACT IN AERS
1.0000 | INHALATION_SPRAY | Freq: Four times a day (QID) | RESPIRATORY_TRACT | Status: DC
  Administered 2018-12-30 – 2019-01-02 (×11): 1 via RESPIRATORY_TRACT
  Filled 2018-12-30: qty 4

## 2018-12-30 MED ORDER — INSULIN STARTER KIT- SYRINGES (SPANISH)
1.0000 | Freq: Once | Status: DC
  Filled 2018-12-30: qty 1

## 2018-12-30 NOTE — Progress Notes (Signed)
PROGRESS NOTE    Chad Mcclain  TDV:[id number] DOB: 06-Nov-1965 DOA: 12/27/2018 PCP: Patient, No Pcp Per   Brief Narrative:  53 y.o. Hispanic male(Limited English) with no known past medical history  Presented with complains of shortness of breath and cough.  Initially presented to the emergency department at [facility] on 7/6 complaining of fever, cough, shortness of breath ongoing for many days.  Patient was tested for COVID-19 and was found to be positive.  However at that time patient was not hypoxic.  He was discharged home.  He presented back to the emergency department this morning with complaints of worsening shortness of breath.  Denies orthopnea or symptoms suggestive of PND.  Patient mentions a cough with whitish expectoration.  Denies any chest pain.  No nausea vomiting.  No diarrhea.  He has had body aches.  No known sick contacts.    In the emergency department he was noted to be hypoxic saturating in the 60s.  Oxygen was immediately applied.  He improved.  He was given steroids.  However patient continued to have oxygen requirements and felt short of breath.  He was changed over to 100% nonrebreather.  Chest x-ray showed bilateral opacities.  Patient was hospitalized for further management.    Subjective: 7/13 A/O x4, states feels fairly well.  Negative CP, positive S OB, negative N/V.   Assessment & Plan:   Active Problems:   Acute respiratory disease due to COVID-19 virus   Acute respiratory failure with hypoxia (HCC)   Hyperglycemia   Hyponatremia   Acute Hypoxic Resp. Failure due to Acute Covid 19 Viral Illness -Not on home O2 - 7/13 currently 8 L O2 via [place]; SPO2 93%  Recent Labs  Lab 12/27/18 0856 12/28/18 0600 12/29/18 0540 12/30/18 0140  CRP 23.3* 17.3* 8.8* 5.1*   -Afebrile last 24 hours - Per previous notes Actemra administered on 7/10 and 7/11 - Solu-Medrol 40 mg twice daily - Combivent every 6 hours -Flutter valve -Continue COVID protocol  vitamins. 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  Patient has not previously been diagnosed with diabetes.  This will be a new diagnosis for him.  CBGs are reasonably well controlled.  CBG should improve as steroid is tapered down.  Continue SSI for now.  Increase to resistant coverage.  Hiccups - Resolved with Thorazine    DVT prophylaxis: Lovenox 40 mg twice daily Code Status: Full Family Communication: None Disposition Plan: TBD   Consultants:  None  Procedures/Significant Events:     I have personally reviewed and interpreted all radiology studies and my findings are as above.  VENTILATOR SETTINGS:    Cultures 7/6/ SARS Coronavirus positive 7/10 blood RIGHT AC NGTD 7/10 blood LEFT wrist NGTD 7/10 urine culture insignificant growth   Antimicrobials: Anti-infectives (From admission, onward)   Start     Stop   12/28/18 1830  remdesivir 100 mg in sodium chloride 0.9 % 250 mL IVPB     01/01/19 1829   12/27/18 1830  remdesivir 200 mg in sodium chloride 0.9 % 250 mL IVPB     12/27/18 2058       Devices    LINES / TUBES:      Continuous Infusions: . sodium chloride    .  chlorproMAZINE (THORAZINE) IV Stopped (12/28/18 2104)  . remdesivir 100 mg in NS 250 mL Stopped (12/29/18 1929)     Objective: Vitals:   12/30/18 0000 12/30/18 0319 12/30/18 0324 12/30/18 0740  BP: 125/75 115/73    Pulse: 61 66 61   Resp: 19 (!) 33 (!) 30   Temp: 98.7 F (37.1 C) 98.4 F (36.9 C)  98.3 F (36.8 C)  TempSrc: Oral Oral  Oral  SpO2: 96% (!) 83% 93%     Intake/Output Summary (Last 24 hours) at 12/30/2018 0741 Last data filed at 12/29/2018 2100 Gross per 24 hour  Intake  1210.61 ml  Output 500 ml  Net 710.61 ml   There were no vitals filed for this visit.  Examination:  General: A/O x4, positive acute respiratory distress currently 8 L O2 via [place]; SPO2 93% Eyes: negative scleral hemorrhage, negative anisocoria, negative icterus ENT: Negative Runny nose, negative gingival bleeding, Neck:  Negative scars, masses, torticollis, lymphadenopathy, JVD Lungs: Clear to auscultation bilaterally without wheezes or crackles Cardiovascular: Regular rate and rhythm without murmur gallop or rub normal S1 and S2 Abdomen: negative abdominal pain, nondistended, positive soft, bowel sounds, no rebound, no ascites, no appreciable mass Extremities: No significant cyanosis, clubbing, or edema bilateral lower extremities Skin: Negative rashes, lesions, ulcers Psychiatric:  Negative depression, negative anxiety, negative fatigue, negative mania  Central nervous system:  Cranial nerves II through XII intact, tongue/uvula midline, all extremities muscle strength 5/5, sensation intact throughout, negative dysarthria, negative expressive aphasia, negative receptive aphasia.  .     Data Reviewed: Care during the described time interval was provided by me .  I have reviewed this patient's available data, including medical history, events of note, physical examination, and all test results as part of my evaluation.   CBC: Recent Labs  Lab 12/23/18 1405 12/27/18 0856 12/28/18 0600 12/29/18 0540 12/30/18 0140  WBC 6.8 11.8* 9.6 15.1* 12.5*  NEUTROABS  --  9.7* 7.7 12.6* 10.7*  HGB 16.0 16.4 15.6 16.0 16.2  HCT 45.7 46.7 45.4 48.4 49.3  MCV 88.4 87.6 91.3 92.9 93.0  PLT 160 266 309 368 406*   Basic Metabolic Panel: Recent Labs  Lab 12/23/18 1405 12/27/18 0856 12/28/18 0600 12/29/18 0540 12/30/18 0140  NA 134* 130* 140 140 138  K 3.7 3.8 3.5 4.1 4.6  CL 98 92* 101 104 100  CO2 26 24 25 26 28   GLUCOSE 219* 200* 184* 180* 231*  BUN 12 11 20 19 20   CREATININE 0.57*  0.54* 0.61 0.62 0.69  CALCIUM 8.1* 8.2* 8.0* 8.4* 8.4*  MG  --   --  2.0 2.2 1.9   GFR: Estimated Creatinine Clearance: 107.7 mL/min (by C-G formula based on SCr of 0.69 mg/dL). Liver Function Tests: Recent Labs  Lab 12/23/18 1405 12/27/18 0856 12/28/18 0600 12/29/18 0540 12/30/18 0140  AST 40 51* 33 29 49*  ALT 35 42 35 35 54*  ALKPHOS 51 62 50 62 65  BILITOT 0.4 1.0 0.5 0.6 0.4  PROT 6.6 6.9 5.8* 5.9* 5.8*  ALBUMIN 3.2* 3.0* 2.4* 2.5* 2.6*   No results for input(s): LIPASE, AMYLASE in the last 168 hours. No results for input(s): AMMONIA in the last 168 hours. Coagulation Profile: No results for input(s): INR, PROTIME in the last 168 hours. Cardiac Enzymes: No results for input(s): CKTOTAL, CKMB, CKMBINDEX, TROPONINI in the last 168 hours. BNP (last 3 results) No results for input(s): PROBNP in the last 8760 hours. HbA1C: Recent Labs    12/28/18  0600  HGBA1C 11.3*   CBG: Recent Labs  Lab 12/29/18 0413 12/29/18 0747 12/29/18 1227 12/29/18 1649 12/29/18 1953  GLUCAP 192* 191* 210* 264* 259*   Lipid Profile: No results for input(s): CHOL, HDL, LDLCALC, TRIG, CHOLHDL, LDLDIRECT in the last 72 hours. Thyroid Function Tests: No results for input(s): TSH, T4TOTAL, FREET4, T3FREE, THYROIDAB in the last 72 hours. Anemia Panel: Recent Labs    12/29/18 0540 12/30/18 0140  FERRITIN 1,175* 1,059*   Urine analysis:    Component Value Date/Time   COLORURINE YELLOW (A) 12/27/2018 1509   APPEARANCEUR CLEAR (A) 12/27/2018 1509   LABSPEC 1.009 12/27/2018 1509   PHURINE 7.0 12/27/2018 1509   GLUCOSEU >=500 (A) 12/27/2018 1509   HGBUR NEGATIVE 12/27/2018 1509   BILIRUBINUR NEGATIVE 12/27/2018 1509   KETONESUR 20 (A) 12/27/2018 1509   PROTEINUR 30 (A) 12/27/2018 1509   NITRITE NEGATIVE 12/27/2018 1509   LEUKOCYTESUR TRACE (A) 12/27/2018 1509   Sepsis Labs: @LABRCNTIP (procalcitonin:4,lacticidven:4)  ) Recent Results (from the past 240 hour(s))  SARS Coronavirus 2  (CEPHEID- Performed [facility] lab), Hosp Order     Status: Abnormal   Collection Time: 12/23/18  2:05 PM   Specimen: Nasopharyngeal Swab  Result Value Ref Range Status   SARS Coronavirus 2 POSITIVE (A) NEGATIVE Final    Comment: RESULT CALLED TO, READ BACK BY AND VERIFIED WITH: AMY COYEN @1508  12/23/18 MJU (NOTE) If result is NEGATIVE SARS-CoV-2 target nucleic acids are NOT DETECTED. The SARS-CoV-2 RNA is generally detectable in upper and lower  respiratory specimens during the acute phase of infection. The lowest  concentration of SARS-CoV-2 viral copies this assay can detect is 250  copies / mL. A negative result does not preclude SARS-CoV-2 infection  and should not be used as the sole basis for treatment or other  patient management decisions.  A negative result may occur with  improper specimen collection / handling, submission of specimen other  than nasopharyngeal swab, presence of viral mutation(s) within the  areas targeted by this assay, and inadequate number of viral copies  (<250 copies / mL). A negative result must be combined with clinical  observations, patient history, and epidemiological information. If result is POSITIVE SARS-CoV-2 target nucleic acids are DETECTED. The SARS-C oV-2 RNA is generally detectable in upper and lower  respiratory specimens during the acute phase of infection.  Positive  results are indicative of active infection with SARS-CoV-2.  Clinical  correlation with patient history and other diagnostic information is  necessary to determine patient infection status.  Positive results do  not rule out bacterial infection or co-infection with other viruses. If result is PRESUMPTIVE POSTIVE SARS-CoV-2 nucleic acids MAY BE PRESENT.   A presumptive positive result was obtained on the submitted specimen  and confirmed on repeat testing.  While 2019 novel coronavirus  (SARS-CoV-2) nucleic acids may be present in the submitted sample   additional confirmatory testing may be necessary for epidemiological  and / or clinical management purposes  to differentiate between  SARS-CoV-2 and other Sarbecovirus currently known to infect humans.  If clinically indicated additional testing with an alternate test  methodology [phone] is advised.  The SARS-CoV-2 RNA is generally  detectable in upper and lower respiratory specimens during the acute  phase of infection. The expected result is Negative. Fact Sheet for Patients:  BoilerBrush.com.cyhttps://www.fda.gov/media/136312/download Fact Sheet for Healthcare Providers: https://pope.com/https://www.fda.gov/media/136313/download This test is not yet approved or cleared by the [place] FDA and has been authorized for detection and/or diagnosis  of SARS-CoV-2 by FDA under an Emergency Use Authorization (EUA).  This EUA will remain in effect (meaning this test can be used) for the duration of the COVID-19 declaration under Section 564(b)(1) of the Act, 21 U.S.C. section 360bbb-3(b)(1), unless the authorization is terminated or revoked sooner. Performed at [facility], [address] [place], [place] [address]   Culture, blood (routine x 2)     Status: None (Preliminary result)   Collection Time: 12/27/18  8:56 AM   Specimen: BLOOD  Result Value Ref Range Status   Specimen Description BLOOD RAC  Final   Special Requests   Final    BOTTLES DRAWN AEROBIC AND ANAEROBIC Blood Culture adequate volume   Culture   Final    NO GROWTH 3 DAYS Performed at [facility], [address], [place], [place] [address]    Report Status PENDING  Incomplete  Culture, blood (routine x 2)     Status: None (Preliminary result)   Collection Time: 12/27/18  8:56 AM   Specimen: BLOOD  Result Value Ref Range Status   Specimen Description BLOOD LT WRIST  Final   Special Requests   Final    BOTTLES DRAWN AEROBIC AND ANAEROBIC Blood Culture adequate volume   Culture   Final    NO GROWTH 3 DAYS Performed at  [facility], [address], [place], [place] [address]    Report Status PENDING  Incomplete  Urine culture     Status: Abnormal   Collection Time: 12/27/18  3:09 PM   Specimen: Urine, Random  Result Value Ref Range Status   Specimen Description   Final    URINE, RANDOM Performed at [facility], [address], [place], [place] [address]    Special Requests   Final    NONE Performed at [facility], [address] [place], [place] [address]    Culture (A)  Final    <10,000 COLONIES/mL INSIGNIFICANT GROWTH Performed at [facility], [address] [place], [place] [address]    Report Status 12/29/2018 FINAL  Final         Radiology Studies: No results found.      Scheduled Meds: . Chlorhexidine Gluconate Cloth  6 each Topical Daily  . docusate sodium  100 mg Oral BID  . enoxaparin (LOVENOX) injection  40 mg Subcutaneous Q12H  . famotidine  20 mg Oral BID  . insulin aspart  0-20 Units Subcutaneous TID WC  . insulin aspart  0-5 Units Subcutaneous QHS  . insulin aspart  4 Units Subcutaneous TID WC  . mouth rinse  15 mL Mouth Rinse BID  . methylPREDNISolone (SOLU-MEDROL) injection  40 mg Intravenous Q12H  . sodium chloride flush  3 mL Intravenous Q12H  . vitamin C  500 mg Oral Daily  . zinc sulfate  220 mg Oral Daily   Continuous Infusions: . sodium chloride    . chlorproMAZINE (THORAZINE) IV Stopped (12/28/18 2104)  . remdesivir 100 mg in NS 250 mL Stopped (12/29/18 1929)     LOS: 3 days   The patient is critically ill with multiple organ systems failure and requires high complexity decision making for assessment and support, frequent evaluation and titration of therapies, application of advanced monitoring technologies and extensive interpretation of multiple databases. Critical Care Time devoted to patient care services described in this note  Time spent: 40 minutes     WOODS, Roselind Messier, MD [facility] Pager  [phone]  If 7PM-7AM,  please contact night-coverage [email or web address] Password [email or web address] 12/30/2018, 7:41 AM

## 2018-12-30 NOTE — Progress Notes (Signed)
Nutrition Consult for Diet Education  RD working remotely.  RD consulted for nutrition education regarding new onset diabetes. Spoke with patient over the phone via Axie Hayne-Clark # [phone]. Patient reports that he knows how to follow a diabetic diet because his wife has had diabetes for 15 years. His family eats whole wheat toast, oatmeal, and eggs for breakfast. They have a grilled protein with small amounts of rice and beans with a lot of salad for lunch and supper. He also drinks a lot of water.   Lab Results  Component Value Date   HGBA1C 11.3 (H) 12/28/2018    RD mailed handout "Carbohydrate Counting for People with Diabetes" from the Academy of Nutrition and Dietetics to patient's home address. Discussed different food groups and their effects on blood sugar, emphasizing carbohydrate-containing foods. Provided list of carbohydrates and recommended serving sizes of common foods.  Discussed importance of controlled and consistent carbohydrate intake throughout the day. Provided examples of ways to balance meals/snacks and encouraged intake of high-fiber, whole grain complex carbohydrates. Teach back method used.  Expect good compliance.  BMI = 23.68 Pt meets criteria for normal weight based on current BMI.  Current diet order is regular, patient is consuming approximately 100% of meals at this time. Labs and medications reviewed. No further nutrition interventions warranted at this time. RD contact information provided. If additional nutrition issues arise, please re-consult RD.  Molli Barrows, RD, LDN, [place] Pager [phone] After Hours Pager [phone]

## 2018-12-30 NOTE — Progress Notes (Signed)
Inpatient Diabetes Program Recommendations  AACE/ADA: New Consensus Statement on Inpatient Glycemic Control (2015)  Target Ranges:  Prepandial:   less than 140 mg/dL      Peak postprandial:   less than 180 mg/dL (1-2 hours)      Critically ill patients:  140 - 180 mg/dL   Lab Results  Component Value Date   GLUCAP 191 (H) 12/30/2018   HGBA1C 11.3 (H) 12/28/2018    Review of Glycemic Control Results for Chad Mcclain, Chad Mcclain (MRN [id number]) as of 12/30/2018 10:07  Ref. Range 12/29/2018 07:47 12/29/2018 12:27 12/29/2018 16:49 12/29/2018 19:53 12/30/2018 07:39  Glucose-Capillary Latest Ref Range: 70 - 99 mg/dL 191 (H) 210 (H) 264 (H) 259 (H) 191 (H)   Admit with: COVID 19  History: None  Current Orders: Novolog Resistant Correction Scale/ SSI (0-20 units) TID AC + HS                           Novolog 4 units TID with meals  Inpatient Diabetes Program Recommendations:    -Increase Lantus to 15 units daily (0.2 units/kg x 74.8 kg)  Spoke with patient by phone using interpretive line with interpretor #[phone]. Spoke with pt about new diagnosis. Discussed A1C 11.3 (average blood glucose 278 over the past 2-3 months) results with them and explained what an A1C is, basic pathophysiology of DM Type 2, basic home care, basic diabetes diet nutrition principles, importance of checking CBGs and maintaining good CBG control to prevent long-term and short-term complications. Reviewed signs and symptoms of hyperglycemia and hypoglycemia and how to treat hypoglycemia at home. Also reviewed blood sugar goals at home.  RNs to provide ongoing basic DM education at bedside with this patient. Have ordered insulin starter kit. Patient states his wife has diabetes and takes daily insulin injections via syringe. Patient prefers syringe if discharged home on insulin.  Nurses, please continue insulin injection teaching and allow patient to give own injections as appropriate to prepare. Patient states he mostly drinks  water and coffee (small amount sugar added, but willing to change to sugar free). Reviewed basic nutrition information and answered questions. Patient acknowledged understanding of information reviewed.  Thank you, Nani Gasser. Josmar Messimer, RN, MSN, CDE  Diabetes Coordinator Inpatient Glycemic Control Team Team Pager [phone] (8am-5pm) 12/30/2018 10:40 AM

## 2018-12-31 DIAGNOSIS — E1165 Type 2 diabetes mellitus with hyperglycemia: Secondary | ICD-10-CM | POA: Diagnosis present

## 2018-12-31 DIAGNOSIS — IMO0002 Reserved for concepts with insufficient information to code with codable children: Secondary | ICD-10-CM | POA: Diagnosis present

## 2018-12-31 DIAGNOSIS — E119 Type 2 diabetes mellitus without complications: Secondary | ICD-10-CM

## 2018-12-31 LAB — CBC WITH DIFFERENTIAL/PLATELET
Abs Immature Granulocytes: 0.56 10*3/uL — ABNORMAL HIGH (ref 0.00–0.07)
Basophils Absolute: 0.1 10*3/uL (ref 0.0–0.1)
Basophils Relative: 1 %
Eosinophils Absolute: 0 10*3/uL (ref 0.0–0.5)
Eosinophils Relative: 0 %
HCT: 50.5 % (ref 39.0–52.0)
Hemoglobin: 16.4 g/dL (ref 13.0–17.0)
Immature Granulocytes: 5 %
Lymphocytes Relative: 8 %
Lymphs Abs: 0.9 10*3/uL (ref 0.7–4.0)
MCH: 30.3 pg (ref 26.0–34.0)
MCHC: 32.5 g/dL (ref 30.0–36.0)
MCV: 93.2 fL (ref 80.0–100.0)
Monocytes Absolute: 0.7 10*3/uL (ref 0.1–1.0)
Monocytes Relative: 6 %
Neutro Abs: 10 10*3/uL — ABNORMAL HIGH (ref 1.7–7.7)
Neutrophils Relative %: 80 %
Platelets: 425 10*3/uL — ABNORMAL HIGH (ref 150–400)
RBC: 5.42 MIL/uL (ref 4.22–5.81)
RDW: 12.1 % (ref 11.5–15.5)
WBC: 12.3 10*3/uL — ABNORMAL HIGH (ref 4.0–10.5)
nRBC: 0 % (ref 0.0–0.2)

## 2018-12-31 LAB — COMPREHENSIVE METABOLIC PANEL
ALT: 82 U/L — ABNORMAL HIGH (ref 0–44)
AST: 58 U/L — ABNORMAL HIGH (ref 15–41)
Albumin: 2.8 g/dL — ABNORMAL LOW (ref 3.5–5.0)
Alkaline Phosphatase: 67 U/L (ref 38–126)
Anion gap: 13 (ref 5–15)
BUN: 19 mg/dL (ref 6–20)
CO2: 25 mmol/L (ref 22–32)
Calcium: 8.4 mg/dL — ABNORMAL LOW (ref 8.9–10.3)
Chloride: 99 mmol/L (ref 98–111)
Creatinine, Ser: 0.66 mg/dL (ref 0.61–1.24)
GFR calc Af Amer: >60 mL/min (ref >60)
GFR calc non Af Amer: >60 mL/min (ref >60)
Glucose, Bld: 160 mg/dL — ABNORMAL HIGH (ref 70–99)
Potassium: 3.9 mmol/L (ref 3.5–5.1)
Sodium: 137 mmol/L (ref 135–145)
Total Bilirubin: 0.3 mg/dL (ref 0.3–1.2)
Total Protein: 5.8 g/dL — ABNORMAL LOW (ref 6.5–8.1)

## 2018-12-31 LAB — GLUCOSE, CAPILLARY
Glucose-Capillary: 197 mg/dL — ABNORMAL HIGH (ref 70–99)
Glucose-Capillary: 315 mg/dL — ABNORMAL HIGH (ref 70–99)
Glucose-Capillary: 238 mg/dL — ABNORMAL HIGH (ref 70–99)
Glucose-Capillary: 251 mg/dL — ABNORMAL HIGH (ref 70–99)

## 2018-12-31 LAB — C-REACTIVE PROTEIN: CRP: 2.7 mg/dL — ABNORMAL HIGH (ref <1.0)

## 2018-12-31 LAB — FERRITIN: Ferritin: 961 ng/mL — ABNORMAL HIGH (ref 24–336)

## 2018-12-31 LAB — LIPID PANEL
Cholesterol: 110 mg/dL (ref 0–200)
HDL: 27 mg/dL — ABNORMAL LOW (ref >40)
LDL Cholesterol: 71 mg/dL (ref 0–99)
Total CHOL/HDL Ratio: 4.1 RATIO
Triglycerides: 59 mg/dL (ref <150)
VLDL: 12 mg/dL (ref 0–40)

## 2018-12-31 LAB — MAGNESIUM: Magnesium: 1.8 mg/dL (ref 1.7–2.4)

## 2018-12-31 LAB — D-DIMER, QUANTITATIVE: D-Dimer, Quant: <0.27 ug/mL-FEU (ref 0.00–0.50)

## 2018-12-31 MED ORDER — INSULIN GLARGINE 100 UNIT/ML ~~LOC~~ SOLN
15.0000 [IU] | Freq: Every day | SUBCUTANEOUS | Status: DC
  Administered 2019-01-01 – 2019-01-02 (×2): 15 [IU] via SUBCUTANEOUS
  Filled 2018-12-31 (×2): qty 0.15

## 2018-12-31 MED ORDER — ATORVASTATIN CALCIUM 40 MG PO TABS
40.0000 mg | ORAL_TABLET | Freq: Every day | ORAL | Status: DC
  Administered 2018-12-31 – 2019-01-01 (×2): 40 mg via ORAL
  Filled 2018-12-31 (×2): qty 1

## 2018-12-31 MED ORDER — INSULIN ASPART 100 UNIT/ML ~~LOC~~ SOLN
8.0000 [IU] | Freq: Three times a day (TID) | SUBCUTANEOUS | Status: DC
  Administered 2018-12-31 – 2019-01-02 (×6): 8 [IU] via SUBCUTANEOUS

## 2018-12-31 NOTE — Progress Notes (Signed)
Daughter called and updated. Had no questions.

## 2018-12-31 NOTE — Progress Notes (Signed)
PROGRESS NOTE    Chad Mcclain  KZS:[id number] DOB: December 25, 1965 DOA: 12/27/2018 PCP: Patient, No Pcp Per   Brief Narrative:  53 y.o. Hispanic male(Limited English) with no known past medical history  Presented with complains of shortness of breath and cough.  Initially presented to the emergency department at [facility] on 7/6 complaining of fever, cough, shortness of breath ongoing for many days.  Patient was tested for COVID-19 and was found to be positive.  However at that time patient was not hypoxic.  He was discharged home.  He presented back to the emergency department this morning with complaints of worsening shortness of breath.  Denies orthopnea or symptoms suggestive of PND.  Patient mentions a cough with whitish expectoration.  Denies any chest pain.  No nausea vomiting.  No diarrhea.  He has had body aches.  No known sick contacts.    In the emergency department he was noted to be hypoxic saturating in the 60s.  Oxygen was immediately applied.  He improved.  He was given steroids.  However patient continued to have oxygen requirements and felt short of breath.  He was changed over to 100% nonrebreather.  Chest x-ray showed bilateral opacities.  Patient was hospitalized for further management.    Subjective: 7/14 currently 4 L via  [place]: SPO2 94%, A/O x4, positive S OB, negative N/V, negative abdominal pain, positive cough with exertion    Assessment & Plan:   Active Problems:   Acute respiratory disease due to COVID-19 virus   Acute respiratory failure with hypoxia (HCC)   Hyperglycemia   Hyponatremia   Diabetes mellitus type 2, uncontrolled, with complications ([place])   Newly diagnosed diabetes ([place])   Acute Hypoxic Resp. Failure due to Acute Covid 19 Viral Illness -Not on home O2 - 7/14 currently 4 L via [place]: SPO2 94%  Recent Labs  Lab 12/27/18 0856 12/28/18 0600 12/29/18 0540 12/30/18 0140 12/31/18 0200  CRP 23.3* 17.3* 8.8* 5.1* 2.7*   -Afebrile last 24 hours  - Per previous notes Actemra administered on 7/10 and 7/11 - Solu-Medrol 40 mg twice daily - Combivent every 6 hours -Flutter valve -Continue COVID protocol vitamins. - Titrate O2 to maintain SPO2> 93%  Hyponatremia -Resolved  Diabetes type 2 uncontrolled with complications (newly diagnosed) - 7/11 hemoglobin A1c= 11.3 - 7/14 increase Lantus 15 units daily - 7/14 increase NovoLog 8 units QAC - Resistant SSI  HLD -7/14 lipid panel not within ADA guidelines. - Start Lipitor 40 mg daily  Hiccups - Resolved with Thorazine    DVT prophylaxis: Lovenox 40 mg twice daily Code Status: Full Family Communication: None Disposition Plan: TBD   Consultants:  None  Procedures/Significant Events:     I have personally reviewed and interpreted all radiology studies and my findings are as above.  VENTILATOR SETTINGS:    Cultures 7/6/ SARS Coronavirus positive 7/10 blood RIGHT AC NGTD 7/10 blood LEFT wrist NGTD 7/10 urine culture insignificant growth   Antimicrobials: Anti-infectives (From admission, onward)   Start     Stop   12/28/18 1830  remdesivir 100 mg in sodium chloride 0.9 % 250 mL IVPB     01/01/19 1829   12/27/18 1830  remdesivir 200 mg in sodium chloride 0.9 % 250 mL IVPB     12/27/18 2058       Devices    LINES / TUBES:      Continuous Infusions: . sodium chloride    . chlorproMAZINE (THORAZINE) IV Stopped (12/28/18 2104)  . remdesivir 100 mg  in NS 250 mL Stopped (12/30/18 2021)     Objective: Vitals:   12/31/18 0417 12/31/18 0734 12/31/18 1118 12/31/18 1556  BP: 121/73   130/78  Pulse:      Resp: (!) 22     Temp: 98.4 F (36.9 C) 97.8 F (36.6 C) 98.1 F (36.7 C) 98.1 F (36.7 C)  TempSrc: Oral Oral Oral Oral  SpO2: 93%       Intake/Output Summary (Last 24 hours) at 12/31/2018 1604 Last data filed at 12/31/2018 1326 Gross per 24 hour  Intake 1116.52 ml  Output -  Net 1116.52 ml   There were no vitals filed for this visit.   Physical Exam:  General: A/O x4, positive acute respiratory distress currently 4 L via [place]: SPO2 94% Eyes: negative scleral hemorrhage, negative anisocoria, negative icterus ENT: Negative Runny nose, negative gingival bleeding, Neck:  Negative scars, masses, torticollis, lymphadenopathy, JVD Lungs: Clear to auscultation bilaterally without wheezes or crackles Cardiovascular: Regular rate and rhythm without murmur gallop or rub normal S1 and S2 Abdomen: negative abdominal pain, nondistended, positive soft, bowel sounds, no rebound, no ascites, no appreciable mass Extremities: No significant cyanosis, clubbing, or edema bilateral lower extremities Skin: Negative rashes, lesions, ulcers Psychiatric:  Negative depression, negative anxiety, negative fatigue, negative mania  Central nervous system:  Cranial nerves II through XII intact, tongue/uvula midline, all extremities muscle strength 5/5, sensation intact throughout,  negative expressive aphasia, negative receptive aphasia..     Data Reviewed: Care during the described time interval was provided by me .  I have reviewed this patient's available data, including medical history, events of note, physical examination, and all test results as part of my evaluation.   CBC: Recent Labs  Lab 12/27/18 0856 12/28/18 0600 12/29/18 0540 12/30/18 0140 12/31/18 0200  WBC 11.8* 9.6 15.1* 12.5* 12.3*  NEUTROABS 9.7* 7.7 12.6* 10.7* 10.0*  HGB 16.4 15.6 16.0 16.2 16.4  HCT 46.7 45.4 48.4 49.3 50.5  MCV 87.6 91.3 92.9 93.0 93.2  PLT 266 309 368 406* 027*   Basic Metabolic Panel: Recent Labs  Lab 12/27/18 0856 12/28/18 0600 12/29/18 0540 12/30/18 0140 12/31/18 0200  NA 130* 140 140 138 137  K 3.8 3.5 4.1 4.6 3.9  CL 92* 101 104 100 99  CO2 24 25 26 28 25   GLUCOSE 200* 184* 180* 231* 160*  BUN 11 20 19 20 19   CREATININE 0.54* 0.61 0.62 0.69 0.66  CALCIUM 8.2* 8.0* 8.4* 8.4* 8.4*  MG  --  2.0 2.2 1.9 1.8   GFR: Estimated Creatinine  Clearance: 107.7 mL/min (by C-G formula based on SCr of 0.66 mg/dL). Liver Function Tests: Recent Labs  Lab 12/27/18 0856 12/28/18 0600 12/29/18 0540 12/30/18 0140 12/31/18 0200  AST 51* 33 29 49* 58*  ALT 42 35 35 54* 82*  ALKPHOS 62 50 62 65 67  BILITOT 1.0 0.5 0.6 0.4 0.3  PROT 6.9 5.8* 5.9* 5.8* 5.8*  ALBUMIN 3.0* 2.4* 2.5* 2.6* 2.8*   No results for input(s): LIPASE, AMYLASE in the last 168 hours. No results for input(s): AMMONIA in the last 168 hours. Coagulation Profile: No results for input(s): INR, PROTIME in the last 168 hours. Cardiac Enzymes: No results for input(s): CKTOTAL, CKMB, CKMBINDEX, TROPONINI in the last 168 hours. BNP (last 3 results) No results for input(s): PROBNP in the last 8760 hours. HbA1C: No results for input(s): HGBA1C in the last 72 hours. CBG: Recent Labs  Lab 12/30/18 1113 12/30/18 1847 12/30/18 2105 12/31/18 0731 12/31/18  [place]   Lipid Profile: Recent Labs    12/31/18 0200  CHOL 110  HDL 27*  LDLCALC 71  TRIG 59  CHOLHDL 4.1   Thyroid Function Tests: No results for input(s): TSH, T4TOTAL, FREET4, T3FREE, THYROIDAB in the last 72 hours. Anemia Panel: Recent Labs    12/30/18 0140 12/31/18 0200  FERRITIN 1,059* 961*   Urine analysis:    Component Value Date/Time   COLORURINE YELLOW (A) 12/27/2018 1509   APPEARANCEUR CLEAR (A) 12/27/2018 1509   LABSPEC 1.009 12/27/2018 1509   PHURINE 7.0 12/27/2018 1509   GLUCOSEU >=500 (A) 12/27/2018 1509   HGBUR NEGATIVE 12/27/2018 1509   BILIRUBINUR NEGATIVE 12/27/2018 1509   KETONESUR 20 (A) 12/27/2018 1509   PROTEINUR 30 (A) 12/27/2018 1509   NITRITE NEGATIVE 12/27/2018 1509   LEUKOCYTESUR TRACE (A) 12/27/2018 1509   Sepsis Labs: @LABRCNTIP (ASNKNLZJQBHAL:9,FXTKWIOXBDZ:3)  ) Recent Results (from the past 240 hour(s))  SARS Coronavirus 2 (CEPHEID- Performed in [place] hospital lab), Hosp Order     Status: Abnormal   Collection Time:  12/23/18  2:05 PM   Specimen: Nasopharyngeal Swab  Result Value Ref Range Status   SARS Coronavirus 2 POSITIVE (A) NEGATIVE Final    Comment: RESULT CALLED TO, READ BACK BY AND VERIFIED WITH: AMY COYEN @1508  12/23/18 MJU (NOTE) If result is NEGATIVE SARS-CoV-2 target nucleic acids are NOT DETECTED. The SARS-CoV-2 RNA is generally detectable in upper and lower  respiratory specimens during the acute phase of infection. The lowest  concentration of SARS-CoV-2 viral copies this assay can detect is 250  copies / mL. A negative result does not preclude SARS-CoV-2 infection  and should not be used as the sole basis for treatment or other  patient management decisions.  A negative result may occur with  improper specimen collection / handling, submission of specimen other  than nasopharyngeal swab, presence of viral mutation(s) within the  areas targeted by this assay, and inadequate number of viral copies  (<250 copies / mL). A negative result must be combined with clinical  observations, patient history, and epidemiological information. If result is POSITIVE SARS-CoV-2 target nucleic acids are DETECTED. The SARS-C oV-2 RNA is generally detectable in upper and lower  respiratory specimens during the acute phase of infection.  Positive  results are indicative of active infection with SARS-CoV-2.  Clinical  correlation with patient history and other diagnostic information is  necessary to determine patient infection status.  Positive results do  not rule out bacterial infection or co-infection with other viruses. If result is PRESUMPTIVE POSTIVE SARS-CoV-2 nucleic acids MAY BE PRESENT.   A presumptive positive result was obtained on the submitted specimen  and confirmed on repeat testing.  While 2019 novel coronavirus  (SARS-CoV-2) nucleic acids may be present in the submitted sample  additional confirmatory testing may be necessary for epidemiological  and / or clinical management purposes   to differentiate between  SARS-CoV-2 and other Sarbecovirus currently known to infect humans.  If clinically indicated additional testing with an alternate test  methodology [phone]) is advised.  The SARS-CoV-2 RNA is generally  detectable in upper and lower respiratory specimens during the acute  phase of infection. The expected result is Negative. Fact Sheet for Patients:  StrictlyIdeas.no Fact Sheet for Healthcare Providers: BankingDealers.co.za This test is not yet approved or cleared by the [place] FDA and has been authorized for detection and/or diagnosis of SARS-CoV-2 by FDA under an Emergency Use Authorization (EUA).  This EUA will remain in effect (meaning this test can be used) for the duration of the COVID-19 declaration under Section 564(b)(1) of the Act, 21 U.S.C. section 360bbb-3(b)(1), unless the authorization is terminated or revoked sooner. Performed at [facility], [address] [place], [place] [address]   Culture, blood (routine x 2)     Status: None (Preliminary result)   Collection Time: 12/27/18  8:56 AM   Specimen: BLOOD  Result Value Ref Range Status   Specimen Description BLOOD RAC  Final   Special Requests   Final    BOTTLES DRAWN AEROBIC AND ANAEROBIC Blood Culture adequate volume   Culture   Final    NO GROWTH 4 DAYS Performed at [facility], [address] [place], [place] [address]    Report Status PENDING  Incomplete  Culture, blood (routine x 2)     Status: None (Preliminary result)   Collection Time: 12/27/18  8:56 AM   Specimen: BLOOD  Result Value Ref Range Status   Specimen Description BLOOD LT WRIST  Final   Special Requests   Final    BOTTLES DRAWN AEROBIC AND ANAEROBIC Blood Culture adequate volume   Culture   Final    NO GROWTH 4 DAYS Performed at [facility], [address], [place], [place] [address]    Report Status PENDING  Incomplete   Urine culture     Status: Abnormal   Collection Time: 12/27/18  3:09 PM   Specimen: Urine, Random  Result Value Ref Range Status   Specimen Description   Final    URINE, RANDOM Performed at [facility], [address] [place], [place] [address]    Special Requests   Final    NONE Performed at [facility], [address] [place], [place] [address]    Culture (A)  Final    <10,000 COLONIES/mL INSIGNIFICANT GROWTH Performed at [facility], [place] [address] [place],  [address]    Report Status 12/29/2018 FINAL  Final         Radiology Studies: No results found.      Scheduled Meds: . atorvastatin  40 mg Oral q1800  . Chlorhexidine Gluconate Cloth  6 each Topical Daily  . docusate sodium  100 mg Oral BID  . enoxaparin (LOVENOX) injection  40 mg Subcutaneous Q12H  . famotidine  20 mg Oral BID  . insulin aspart  0-20 Units Subcutaneous TID WC  . insulin aspart  0-5 Units Subcutaneous QHS  . insulin aspart  8 Units Subcutaneous TID WC  . [START ON 01/01/2019] insulin glargine  15 Units Subcutaneous Daily  . insulin starter kit- syringes  1 kit Other Once  . Ipratropium-Albuterol  1 puff Inhalation Q6H  . mouth rinse  15 mL Mouth Rinse BID  . methylPREDNISolone (SOLU-MEDROL) injection  40 mg Intravenous Q12H  . sodium chloride flush  3 mL Intravenous Q12H  . vitamin C  500 mg Oral Daily  . zinc sulfate  220 mg Oral Daily   Continuous Infusions: . sodium chloride    . chlorproMAZINE (THORAZINE) IV Stopped (12/28/18 2104)  . remdesivir 100 mg in NS 250 mL Stopped (12/30/18 2021)     LOS: 4 days   The patient is critically ill with multiple organ systems failure and requires high complexity decision making for assessment and support, frequent evaluation and titration of therapies, application of advanced monitoring technologies and extensive interpretation of multiple databases. Critical Care Time devoted to  patient care services  described in this note  Time spent: 40 minutes     , Geraldo Docker, MD [facility] Pager [phone]  If 7PM-7AM, please contact night-coverage [email or web address] Password [facility] 12/31/2018, 4:04 PM

## 2018-12-31 NOTE — Progress Notes (Signed)
Inpatient Diabetes Program Recommendations  AACE/ADA: New Consensus Statement on Inpatient Glycemic Control (2015)  Target Ranges:  Prepandial:   less than 140 mg/dL      Peak postprandial:   less than 180 mg/dL (1-2 hours)      Critically ill patients:  140 - 180 mg/dL   Lab Results  Component Value Date   GLUCAP 236 (H) 12/30/2018   HGBA1C 11.3 (H) 12/28/2018    Review of Glycemic Control Results for Chad Mcclain, Chad Mcclain (MRN [id number]) as of 12/31/2018 09:16  Ref. Range 12/29/2018 19:53 12/30/2018 07:39 12/30/2018 11:13 12/30/2018 18:47 12/30/2018 21:05  Glucose-Capillary Latest Ref Range: 70 - 99 mg/dL 259 (H) 191 (H) 315 (H) 350 (H) 236 (H)   Inpatient Diabetes Program Recommendations:    Noted postprandial CBGs elevated. Please consider: -Increase Novolog meal coverage to 6 units tid if eats 50%  Thank you, Bethena Roys E. Jozalyn Baglio, RN, MSN, CDE  Diabetes Coordinator Inpatient Glycemic Control Team Team Pager [phone] (8am-5pm) 12/31/2018 9:17 AM

## 2019-01-01 LAB — CULTURE, BLOOD (ROUTINE X 2)
Culture: NO GROWTH
Culture: NO GROWTH
Special Requests: ADEQUATE
Special Requests: ADEQUATE

## 2019-01-01 LAB — COMPREHENSIVE METABOLIC PANEL
ALT: 88 U/L — ABNORMAL HIGH (ref 0–44)
AST: 53 U/L — ABNORMAL HIGH (ref 15–41)
Albumin: 2.7 g/dL — ABNORMAL LOW (ref 3.5–5.0)
Alkaline Phosphatase: 65 U/L (ref 38–126)
Anion gap: 15 (ref 5–15)
BUN: 21 mg/dL — ABNORMAL HIGH (ref 6–20)
CO2: 23 mmol/L (ref 22–32)
Calcium: 8.8 mg/dL — ABNORMAL LOW (ref 8.9–10.3)
Chloride: 101 mmol/L (ref 98–111)
Creatinine, Ser: 0.74 mg/dL (ref 0.61–1.24)
GFR calc Af Amer: >60 mL/min (ref >60)
GFR calc non Af Amer: >60 mL/min (ref >60)
Glucose, Bld: 320 mg/dL — ABNORMAL HIGH (ref 70–99)
Potassium: 4.7 mmol/L (ref 3.5–5.1)
Sodium: 139 mmol/L (ref 135–145)
Total Bilirubin: 0.4 mg/dL (ref 0.3–1.2)
Total Protein: 5.8 g/dL — ABNORMAL LOW (ref 6.5–8.1)

## 2019-01-01 LAB — CBC WITH DIFFERENTIAL/PLATELET
Abs Immature Granulocytes: 0.53 10*3/uL — ABNORMAL HIGH (ref 0.00–0.07)
Basophils Absolute: 0.1 10*3/uL (ref 0.0–0.1)
Basophils Relative: 1 %
Eosinophils Absolute: 0 10*3/uL (ref 0.0–0.5)
Eosinophils Relative: 1 %
HCT: 51 % (ref 39.0–52.0)
Hemoglobin: 17.2 g/dL — ABNORMAL HIGH (ref 13.0–17.0)
Immature Granulocytes: 7 %
Lymphocytes Relative: 10 %
Lymphs Abs: 0.8 10*3/uL (ref 0.7–4.0)
MCH: 31.2 pg (ref 26.0–34.0)
MCHC: 33.7 g/dL (ref 30.0–36.0)
MCV: 92.6 fL (ref 80.0–100.0)
Monocytes Absolute: 0.3 10*3/uL (ref 0.1–1.0)
Monocytes Relative: 4 %
Neutro Abs: 6.1 10*3/uL (ref 1.7–7.7)
Neutrophils Relative %: 77 %
Platelets: 371 10*3/uL (ref 150–400)
RBC: 5.51 MIL/uL (ref 4.22–5.81)
RDW: 12.1 % (ref 11.5–15.5)
WBC: 7.8 10*3/uL (ref 4.0–10.5)
nRBC: 0 % (ref 0.0–0.2)

## 2019-01-01 LAB — C-REACTIVE PROTEIN: CRP: 1.3 mg/dL — ABNORMAL HIGH (ref <1.0)

## 2019-01-01 LAB — GLUCOSE, CAPILLARY
Glucose-Capillary: 272 mg/dL — ABNORMAL HIGH (ref 70–99)
Glucose-Capillary: 254 mg/dL — ABNORMAL HIGH (ref 70–99)
Glucose-Capillary: 294 mg/dL — ABNORMAL HIGH (ref 70–99)
Glucose-Capillary: 361 mg/dL — ABNORMAL HIGH (ref 70–99)

## 2019-01-01 LAB — MAGNESIUM: Magnesium: 1.8 mg/dL (ref 1.7–2.4)

## 2019-01-01 LAB — FERRITIN: Ferritin: 818 ng/mL — ABNORMAL HIGH (ref 24–336)

## 2019-01-01 LAB — D-DIMER, QUANTITATIVE: D-Dimer, Quant: 0.28 ug/mL-FEU (ref 0.00–0.50)

## 2019-01-01 MED ORDER — METHYLPREDNISOLONE SODIUM SUCC 125 MG IJ SOLR
60.0000 mg | Freq: Every day | INTRAMUSCULAR | Status: DC
  Administered 2019-01-01 – 2019-01-02 (×2): 60 mg via INTRAVENOUS
  Filled 2019-01-01 (×3): qty 2

## 2019-01-01 MED ORDER — METHYLPREDNISOLONE SODIUM SUCC 125 MG IJ SOLR: 60.0000 mg | Freq: Every day | INTRAMUSCULAR | Status: DC

## 2019-01-01 MED ORDER — ENOXAPARIN SODIUM 40 MG/0.4ML ~~LOC~~ SOLN
40.0000 mg | SUBCUTANEOUS | Status: DC
  Administered 2019-01-01: 21:00:00 40 mg via SUBCUTANEOUS
  Filled 2019-01-01: qty 0.4

## 2019-01-01 NOTE — Progress Notes (Signed)
[facility] Progress Note  Patient: Chad Mcclain QTM:[id number]   PCP: Patient, No Pcp Per DOB: 1966/02/12   DOA: 12/27/2018   DOS: 01/01/2019   Date of Service: the patient was seen and examined on 01/01/2019  Brief hospital course: Pt. with no known PMH; admitted on 12/27/2018, presented with complaint of shortness of breath, was found to have acute COVID-19 viral pneumonia with hypoxic respiratory failure. Currently further plan is continue current care.  Subjective: Reports continues cough with some improvement.  No nausea no vomiting no diarrhea.  No chest pain abdominal pain.  On room air since this morning.  Still fatigue and tired.  Assessment and Plan: 1. Acute COVID-19 Viral illness Lab Results  Component Value Date   SARSCOV2NAA POSITIVE (A) 12/23/2018   CXR: hazy bilateral peripheral opacities  Recent Labs    12/30/18 0140 12/31/18 0200 01/01/19 0230  DDIMER 0.28 <0.27 0.28  FERRITIN 1,059* 961* 818*  CRP 5.1* 2.7* 1.3*    Tmax: Afebrile Oxygen requirements: On room air since 01/01/2019  Antibiotics: 1 dose of IV azithromycin and ceftriaxone on admission Diuretics: None Vitamin C and Zinc: Continue DVT Prophylaxis: Subcutaneous Lovenox reduced from twice daily to daily Remdesivir: Met criteria on admission, completed 5 days of treatment Steroids: Treated with 40 mg twice daily since last 6 days Actemra: Received 2 doses off-label use of Actemra: This patient has confirmed COVID-19 in the setting of the ongoing 2020 coronavirus pandemic.  He has hypoxia and is high-risk for intubation, but expected to survive >48 hours and has good baseline functional status.  he is not known to be on immunomodulators, anti-rejection medications, or cancer chemotherapy, has no history of TB or latent TB, and no history of diverticulitis or intestinal perforation.  Platelets are >50K, ANC is >500, and ALT/AST are below 5x ULN with no known hepatitis B infection. The  investigational nature of this medication was discussed with the patient/HCPOA and they choose to proceed as the potential benefits are felt to outweigh risks at this time.   Prone positioning: Patient encouraged to stay in prone position as much as possible.  During this encounter: Patient Isolation: Airborne + Droplet + Contact HCP PPE: CAPR, gown. gloves Patient PPE: None  The treatment plan and use of medications and known side effects were discussed with patient/family. It was clearly explained that there is no proven definitive treatment for COVID-19 infection yet. Any medications used here are based on case reports/anecdotal data which are not peer-reviewed and has not been studied using randomized control trials.  Complete risks and long-term side effects are unknown, however in the best clinical judgment they seem to be of some clinical benefit rather than medical risks.  Patient/family agree with the treatment plan and want to receive these treatments as indicated.   2.  Hyponatremia.   Sodium 130 on admission. Blood glucose 200 at the time. Likely pseudohyponatremia secondary to hyperglycemia. Currently with improvement.  3.  Type 2 diabetes mellitus, uncontrolled without complication. newly diagnosed. Hemoglobin A1c on admission 11.3. Diabetes coordinator consulted. Patient educated about insulin use. Currently on insulin Lantus as well as sliding scale. Patient prefers syringe for discharging home. Given insulin intubation although wife is already a diabetic and uses insulin at home. Blood glucose still not adequately controlled. Patient is on 15 units of Lantus and has used 45 units of coverage yesterday. Unfortunately still on steroids.  We will reduce the steroids today and monitor response.  Will discuss final dose for discharge tomorrow.  4.  Dyslipidemia. LDL 71. Total cholesterol 110. Patient started on Lipitor 40 mg daily.  5.  Elevated LFT. Liver function test  mildly elevated.. No evidence of abdominal complain by the patient. Monitor for now.  Diet: Carb modified diet DVT Prophylaxis: Subcutaneous Lovenox  Advance goals of care discussion: Full code  Family Communication: no family was present at bedside, at the time of interview.   Disposition:  Discharge to Home likely tomorrow.  Consultants: none Procedures: none  Scheduled Meds: . atorvastatin  40 mg Oral q1800  . Chlorhexidine Gluconate Cloth  6 each Topical Daily  . docusate sodium  100 mg Oral BID  . enoxaparin (LOVENOX) injection  40 mg Subcutaneous Q24H  . famotidine  20 mg Oral BID  . insulin aspart  0-20 Units Subcutaneous TID WC  . insulin aspart  0-5 Units Subcutaneous QHS  . insulin aspart  8 Units Subcutaneous TID WC  . insulin glargine  15 Units Subcutaneous Daily  . insulin starter kit- syringes  1 kit Other Once  . Ipratropium-Albuterol  1 puff Inhalation Q6H  . mouth rinse  15 mL Mouth Rinse BID  . methylPREDNISolone (SOLU-MEDROL) injection  60 mg Intravenous Daily  . sodium chloride flush  3 mL Intravenous Q12H  . vitamin C  500 mg Oral Daily  . zinc sulfate  220 mg Oral Daily   Continuous Infusions: . sodium chloride    . chlorproMAZINE (THORAZINE) IV Stopped (12/28/18 2104)   PRN Meds: sodium chloride, acetaminophen, chlorpheniramine-HYDROcodone, chlorproMAZINE (THORAZINE) IV, guaiFENesin-dextromethorphan, ondansetron **OR** ondansetron (ZOFRAN) IV, sodium chloride flush Antibiotics: Anti-infectives (From admission, onward)   Start     Dose/Rate Route Frequency Ordered Stop   12/28/18 1830  remdesivir 100 mg in sodium chloride 0.9 % 250 mL IVPB     100 mg 500 mL/hr over 30 Minutes Intravenous Every 24 hours 12/27/18 1719 12/31/18 1713   12/27/18 1830  remdesivir 200 mg in sodium chloride 0.9 % 250 mL IVPB     200 mg 500 mL/hr over 30 Minutes Intravenous Once 12/27/18 1719 12/27/18 2058       Objective: Physical Exam: Vitals:   01/01/19 0400  01/01/19 0716 01/01/19 1044 01/01/19 1153  BP: (!) 94/54 108/70  102/60  Pulse: 63 66 92 67  Resp: 16 (!) 22 (!) 21 16  Temp:  98.6 F (37 C)  98.6 F (37 C)  TempSrc:  Oral  Oral  SpO2: 92% 95% 96% 94%  Weight:      Height:        Intake/Output Summary (Last 24 hours) at 01/01/2019 1247 Last data filed at 01/01/2019 1000 Gross per 24 hour  Intake 840 ml  Output -  Net 840 ml   Filed Weights   12/31/18 2149  Weight: 74.8 kg   General: alert and oriented to time, place, and person. Appear in mild distress, affect appropriate Eyes: PERRL, Conjunctiva normal ENT: Oral Mucosa Clear, moist  Neck: no JVD, no Abnormal Mass Or lumps Cardiovascular: S1 and S2 Present, no Murmur, peripheral pulses symmetrical Respiratory: normal respiratory effort, Bilateral Air entry equal and Decreased, no use of accessory muscle, bilateral Crackles, no wheezes Abdomen: Bowel Sound present, Soft and no tenderness, no hernia Skin: no rashes  Extremities: no Pedal edema, no calf tenderness Neurologic: normal without focal findings, mental status, speech normal, alert and oriented x3, PERLA, Motor strength 5/5 and symmetric and sensation grossly normal to light touch Gait not checked due to patient safety concerns  Data Reviewed:  CBC: Recent Labs  Lab 12/28/18 0600 12/29/18 0540 12/30/18 0140 12/31/18 0200 01/01/19 0230  WBC 9.6 15.1* 12.5* 12.3* 7.8  NEUTROABS 7.7 12.6* 10.7* 10.0* 6.1  HGB 15.6 16.0 16.2 16.4 17.2*  HCT 45.4 48.4 49.3 50.5 51.0  MCV 91.3 92.9 93.0 93.2 92.6  PLT 309 368 406* 425* 022   Basic Metabolic Panel: Recent Labs  Lab 12/28/18 0600 12/29/18 0540 12/30/18 0140 12/31/18 0200 01/01/19 0230  NA 140 140 138 137 139  K 3.5 4.1 4.6 3.9 4.7  CL 101 104 100 99 101  CO2 25 26 28 25 23   GLUCOSE 184* 180* 231* 160* 320*  BUN 20 19 20 19  21*  CREATININE 0.61 0.62 0.69 0.66 0.74  CALCIUM 8.0* 8.4* 8.4* 8.4* 8.8*  MG 2.0 2.2 1.9 1.8 1.8    Liver Function Tests:  Recent Labs  Lab 12/28/18 0600 12/29/18 0540 12/30/18 0140 12/31/18 0200 01/01/19 0230  AST 33 29 49* 58* 53*  ALT 35 35 54* 82* 88*  ALKPHOS 50 62 65 67 65  BILITOT 0.5 0.6 0.4 0.3 0.4  PROT 5.8* 5.9* 5.8* 5.8* 5.8*  ALBUMIN 2.4* 2.5* 2.6* 2.8* 2.7*   No results for input(s): LIPASE, AMYLASE in the last 168 hours. No results for input(s): AMMONIA in the last 168 hours. Coagulation Profile: No results for input(s): INR, PROTIME in the last 168 hours. Cardiac Enzymes: No results for input(s): CKTOTAL, CKMB, CKMBINDEX, TROPONINI in the last 168 hours. BNP (last 3 results) No results for input(s): PROBNP in the last 8760 hours. CBG: Recent Labs  Lab 12/31/18 1116 12/31/18 1553 12/31/18 2126 01/01/19 0712 01/01/19 1152  GLUCAP 315* 238* 251* 272* 254*   Studies: No results found.   Time spent: 35 minutes  Author: Berle Mull, MD [facility] 01/01/2019 12:47 PM  To reach On-call, see care teams to locate the attending and reach out to them via [email or web address]. If 7PM-7AM, please contact night-coverage If you still have difficulty reaching the attending provider, please page the [facility] (Director on Call) for [facility] on amion for assistance.

## 2019-01-01 NOTE — Progress Notes (Signed)
Ok to change lovenox to qday per Dr. Marlowe Sax.  Onnie Boer, PharmD, BCIDP, AAHIVP, CPP Infectious Disease Pharmacist 01/01/2019 10:52 AM

## 2019-01-02 DIAGNOSIS — E119 Type 2 diabetes mellitus without complications: Secondary | ICD-10-CM

## 2019-01-02 DIAGNOSIS — E118 Type 2 diabetes mellitus with unspecified complications: Secondary | ICD-10-CM

## 2019-01-02 LAB — GLUCOSE, CAPILLARY
Glucose-Capillary: 192 mg/dL — ABNORMAL HIGH (ref 70–99)
Glucose-Capillary: 167 mg/dL — ABNORMAL HIGH (ref 70–99)
Glucose-Capillary: 301 mg/dL — ABNORMAL HIGH (ref 70–99)

## 2019-01-02 MED ORDER — ASPIRIN EC 81 MG PO TBEC: 81.0000 mg | DELAYED_RELEASE_TABLET | Freq: Every day | ORAL | 11 refills | Status: AC

## 2019-01-02 MED ORDER — ATORVASTATIN CALCIUM 40 MG PO TABS: 40.0000 mg | ORAL_TABLET | Freq: Every day | ORAL | 11 refills | Status: AC

## 2019-01-02 MED ORDER — BLOOD GLUCOSE METER KIT: PACK | 0 refills | Status: AC

## 2019-01-02 MED ORDER — DEXAMETHASONE 6 MG PO TABS: 6.0000 mg | ORAL_TABLET | Freq: Two times a day (BID) | ORAL | 0 refills | Status: DC

## 2019-01-02 MED ORDER — METFORMIN HCL 500 MG PO TABS: 500.0000 mg | ORAL_TABLET | Freq: Every day | ORAL | 11 refills | Status: AC

## 2019-01-02 NOTE — Discharge Summary (Signed)
Physician Discharge Summary  Chad Mcclain MOQ:[id number] DOB: [birth year] DOA: 12/27/2018  PCP: Patient, No Pcp Per  Admit date: 12/27/2018 Discharge date: 01/02/2019  Admitted From: Home  Disposition:  Home   Recommendations for Outpatient Follow-up:  1. Follow up with PCP in 1-2 weeks 2. Please obtain BMP/CBC in one week 3. Please obtain HgbA1c in 3 months 4. Please repeat LFTs in 1-3 months   Home Health: None  Equipment/Devices: None  Discharge Condition: Good  CODE STATUS: FULL Diet recommendation: Diabetic  Brief/Interim Summary: Chad Mcclain is a 53 y.o. M with no significant PMHx who presnented with SOB, found to have COVID-19.       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  Weaned from O2.     Diabetes Possibly newly diagnosed, although patient describes being told in past with "an insulin test" that he nearly had diabetes, and that he had to modify his diet but not take any medicines.  He also, however, describes checking his AM sugar, being anywhere between 130 and 180 mg/dL, fasting.    A1c here was >10%.    Treated here while on steroids with Lantus 15 units and SSI with good results.  Discussed metformin vs Lantus at discharge, and in shared decision making, patient elected for metformin.  He has close PCP follow up.    Transaminitis From COVID.  Needs repeat LFTs.       Discharge Instructions  Discharge Instructions    Diet - low sodium heart healthy   Complete by: As directed    Discharge instructions   Complete by: As directed    You were admitted for coronavirus (Also known as COVID-19)  You were treated with steroids and remdesivir.  You completed the course of remdesivir (a medicine to prevent coronavirus from reproducing) You should finish the course of steroids by taking dexamethasone 6 mg  once daily in the morning for 3 more days  If you have any lingering cough, you should take the cough syrup we gave you here, Robitussin (with the ingredients "GUIAFENESIN" and "DEXTROMETHORPHAN") You may also use the Tussionex cough syrup that you have at home   [place]: There is some uncertainty about this. The CDC and our local health departments recommend you isolate strictly until 10 days from your first symptoms AND at least 3 days from your last fever. My personal recommendation is more strict, and I recommend you isolate for 14 days after discharge.  In the end, your local Health Department have the sole discretion to judge when you may leave your house or return to work.   You have no personal health conditions that require special self-isolation precautions.  Until the health department has cleared you to end your quarantine: If you have anyone in the home who has NOT had coronavirus:    -do not be in the same room with them until your self isolation is over    -wear a mask and have them wear a mask if you MUST be in the same room    -clean all hard surfaces (counters, doors, tables) twice a day    -use a separate bathroom at all times     For the new diabetes: Start taking metformin (the medicine for diabetes) Also take the cholesterol medicine (Lipitor) and an 81 mg baby aspirin each day   Check your blood sugar every morning.  A "normal" blood sugar in the morning when you wake up is between 70 and 120  A "normal" blood sugar after eating is 120 to 180  If your blood sugar is ever less than 70, you might have symptoms of shakes, fatigue, nausea, dizziness.  If this happens, drink some juice and check your sugar again in 15 minutes.  If the symptoms don't go away, go to the ER.  If your sugar is every greater than 400, call your new primary care doctor   Increase activity slowly   Complete by: As directed      Allergies as of 01/02/2019   No  Known Allergies     Medication List    TAKE these medications   aspirin EC 81 MG tablet Take 1 tablet (81 mg total) by mouth daily.   atorvastatin 40 MG tablet Commonly known as: LIPITOR Take 1 tablet (40 mg total) by mouth daily at 6 PM.   blood glucose meter kit and supplies Dispense based on patient and insurance preference. Use up to four times daily as directed. (FOR ICD-10 E10.9, E11.9).   chlorpheniramine-HYDROcodone 10-8 MG/5ML Suer Commonly known as: Tussionex Pennkinetic ER Take 5 mLs by mouth 2 (two) times daily.   dexamethasone 6 MG tablet Commonly known as: DECADRON Take 1 tablet (6 mg total) by mouth 2 (two) times daily with a meal.   metFORMIN 500 MG tablet Commonly known as: Glucophage Take 1 tablet (500 mg total) by mouth daily with breakfast.      Follow-up Information    [facility] Follow up.   Why: you are scheduled for a telephone hospital follow up call on Wednesday, July, 29,2020 at 9:30 am with Dr. Joya Gaskins. Please be available for this call. After this visit you will be setup with a new patient appointment.  Contact information: [place] [phone]         No Known Allergies  Consultations:  None   Procedures/Studies: Dg Chest Portable 1 View  Result Date: 12/27/2018 CLINICAL DATA:  COVID-19 positive.  Shortness of breath. EXAM: PORTABLE CHEST 1 VIEW COMPARISON:  12/23/2018 FINDINGS: Again noted are low lung volumes. Persistent streaky lung densities with slight progression, particularly at the right lung base. Heart size is stable and within normal limits. The trachea is midline. Negative for pneumothorax. IMPRESSION: Mild progression of the bilateral lung densities particularly at the right lung base. Findings are suggestive for bilateral pneumonia. Electronically Signed   By: Markus Daft M.D.   On: 12/27/2018 09:09   Dg Chest Portable 1 View  Result Date:  12/23/2018 CLINICAL DATA:  Fever cough and shortness of breath for 7 days. EXAM: PORTABLE CHEST 1 VIEW COMPARISON:  None. FINDINGS: Cardiomediastinal silhouette is normal. Mediastinal contours appear intact. There are diffuse streaky bilateral airspace opacities with lower lobe predominance. Osseous structures are without acute abnormality. Soft tissues are grossly normal. IMPRESSION: Diffuse streaky bilateral airspace opacities with lower lobe predominance likely represents multifocal pneumonia, including viral pneumonia. Electronically Signed   By: Fidela Salisbury M.D.   On: 12/23/2018 14:05      Subjective: Feels fine.  No chest pain, no dyspnea.  A little tired.  No vomiting or cough.  Discharge Exam: Vitals:   01/02/19 0600 01/02/19 0734  BP:  115/72  Pulse: 63   Resp: 19   Temp:  97.9 F (36.6 C)  SpO2: 90%    Vitals:   01/02/19 0445 01/02/19 0500  01/02/19 0600 01/02/19 0734  BP: 105/64   115/72  Pulse: 64 (!) 59 63   Resp:  (!) 21 19   Temp: 98.6 F (37 C)   97.9 F (36.6 C)  TempSrc: Oral   Oral  SpO2: 92% 91% 90%   Weight:      Height:        General: Pt is alert, awake, not in acute distress Cardiovascular: RRR, nl S1-S2, no murmurs appreciated.   No LE edema.   Respiratory: Normal respiratory rate and rhythm.  CTAB without rales or wheezes. Abdominal: Abdomen soft and non-tender.  No distension or HSM.   Neuro/Psych: Strength symmetric in upper and lower extremities.  Judgment and insight appear normal.   The results of significant diagnostics from this hospitalization (including imaging, microbiology, ancillary and laboratory) are listed below for reference.     Microbiology: Recent Results (from the past 240 hour(s))  SARS Coronavirus 2 (CEPHEID- Performed in [facility] hospital lab), Hosp Order     Status: Abnormal   Collection Time: 12/23/18  2:05 PM   Specimen: Nasopharyngeal Swab  Result Value Ref Range Status   SARS Coronavirus 2 POSITIVE (A)  NEGATIVE Final    Comment: RESULT CALLED TO, READ BACK BY AND VERIFIED WITH: AMY COYEN @1508  12/23/18 MJU (NOTE) If result is NEGATIVE SARS-CoV-2 target nucleic acids are NOT DETECTED. The SARS-CoV-2 RNA is generally detectable in upper and lower  respiratory specimens during the acute phase of infection. The lowest  concentration of SARS-CoV-2 viral copies this assay can detect is 250  copies / mL. A negative result does not preclude SARS-CoV-2 infection  and should not be used as the sole basis for treatment or other  patient management decisions.  A negative result may occur with  improper specimen collection / handling, submission of specimen other  than nasopharyngeal swab, presence of viral mutation(s) within the  areas targeted by this assay, and inadequate number of viral copies  (<250 copies / mL). A negative result must be combined with clinical  observations, patient history, and epidemiological information. If result is POSITIVE SARS-CoV-2 target nucleic acids are DETECTED. The SARS-C oV-2 RNA is generally detectable in upper and lower  respiratory specimens during the acute phase of infection.  Positive  results are indicative of active infection with SARS-CoV-2.  Clinical  correlation with patient history and other diagnostic information is  necessary to determine patient infection status.  Positive results do  not rule out bacterial infection or co-infection with other viruses. If result is PRESUMPTIVE POSTIVE SARS-CoV-2 nucleic acids MAY BE PRESENT.   A presumptive positive result was obtained on the submitted specimen  and confirmed on repeat testing.  While 2019 novel coronavirus  (SARS-CoV-2) nucleic acids may be present in the submitted sample  additional confirmatory testing may be necessary for epidemiological  and / or clinical management purposes  to differentiate between  SARS-CoV-2 and other Sarbecovirus currently known to infect humans.  If clinically  indicated additional testing with an alternate test  methodology [phone]) is advised.  The SARS-CoV-2 RNA is generally  detectable in upper and lower respiratory specimens during the acute  phase of infection. The expected result is Negative. Fact Sheet for Patients:  StrictlyIdeas.no Fact Sheet for Healthcare Providers: BankingDealers.co.za This test is not yet approved or cleared by the [place] FDA and has been authorized for detection and/or diagnosis of SARS-CoV-2 by FDA under an Emergency Use Authorization (EUA).  This EUA will remain in effect (meaning  this test can be used) for the duration of the COVID-19 declaration under Section 564(b)(1) of the Act, 21 U.S.C. section 360bbb-3(b)(1), unless the authorization is terminated or revoked sooner. Performed at [facility], [address] [place], [place] [address]   Culture, blood (routine x 2)     Status: None   Collection Time: 12/27/18  8:56 AM   Specimen: BLOOD  Result Value Ref Range Status   Specimen Description BLOOD RAC  Final   Special Requests   Final    BOTTLES DRAWN AEROBIC AND ANAEROBIC Blood Culture adequate volume   Culture   Final    NO GROWTH 5 DAYS Performed at [facility], [address] [place], [place] [address]    Report Status 01/01/2019 FINAL  Final  Culture, blood (routine x 2)     Status: None   Collection Time: 12/27/18  8:56 AM   Specimen: BLOOD  Result Value Ref Range Status   Specimen Description BLOOD LT WRIST  Final   Special Requests   Final    BOTTLES DRAWN AEROBIC AND ANAEROBIC Blood Culture adequate volume   Culture   Final    NO GROWTH 5 DAYS Performed at [facility], [address] [place], [place] [address]    Report Status 01/01/2019 FINAL  Final  Urine culture     Status: Abnormal   Collection Time: 12/27/18  3:09 PM   Specimen: Urine, Random  Result Value Ref Range Status   Specimen  Description   Final    URINE, RANDOM Performed at [facility], [address], [place], [place] [address]    Special Requests   Final    NONE Performed at [facility], [address], [place], [place] [address]    Culture (A)  Final    <10,000 COLONIES/mL INSIGNIFICANT GROWTH Performed at [facility], [place] [address] [place],  [address]    Report Status 12/29/2018 FINAL  Final     Labs: BNP (last 3 results) Recent Labs    12/27/18 0856  BNP 99.2   Basic Metabolic Panel: Recent Labs  Lab 12/28/18 0600 12/29/18 0540 12/30/18 0140 12/31/18 0200 01/01/19 0230  NA 140 140 138 137 139  K 3.5 4.1 4.6 3.9 4.7  CL 101 104 100 99 101  CO2 25 26 28 25 23   GLUCOSE 184* 180* 231* 160* 320*  BUN 20 19 20 19  21*  CREATININE 0.61 0.62 0.69 0.66 0.74  CALCIUM 8.0* 8.4* 8.4* 8.4* 8.8*  MG 2.0 2.2 1.9 1.8 1.8   Liver Function Tests: Recent Labs  Lab 12/28/18 0600 12/29/18 0540 12/30/18 0140 12/31/18 0200 01/01/19 0230  AST 33 29 49* 58* 53*  ALT 35 35 54* 82* 88*  ALKPHOS 50 62 65 67 65  BILITOT 0.5 0.6 0.4 0.3 0.4  PROT 5.8* 5.9* 5.8* 5.8* 5.8*  ALBUMIN 2.4* 2.5* 2.6* 2.8* 2.7*   No results for input(s): LIPASE, AMYLASE in the last 168 hours. No results for input(s): AMMONIA in the last 168 hours. CBC: Recent Labs  Lab 12/28/18 0600 12/29/18 0540 12/30/18 0140 12/31/18 0200 01/01/19 0230  WBC 9.6 15.1* 12.5* 12.3* 7.8  NEUTROABS 7.7 12.6* 10.7* 10.0* 6.1  HGB 15.6 16.0 16.2 16.4 17.2*  HCT 45.4 48.4 49.3 50.5 51.0  MCV 91.3 92.9 93.0 93.2 92.6  PLT 309 368 406* 425* 371   Cardiac Enzymes: No results for input(s): CKTOTAL, CKMB, CKMBINDEX, TROPONINI in the last 168 hours. BNP: Invalid  input(s): POCBNP CBG: Recent Labs  Lab 01/01/19 1612 01/01/19 2042 01/02/19 0641 01/02/19 0833 01/02/19 1148  GLUCAP 294* 361* 192* 167* 301*   D-Dimer Recent Labs    12/31/18 0200 01/01/19 0230  DDIMER <0.27 0.28   Hgb A1c No  results for input(s): HGBA1C in the last 72 hours. Lipid Profile Recent Labs    12/31/18 0200  CHOL 110  HDL 27*  LDLCALC 71  TRIG 59  CHOLHDL 4.1   Thyroid function studies No results for input(s): TSH, T4TOTAL, T3FREE, THYROIDAB in the last 72 hours.  Invalid input(s): FREET3 Anemia work up Recent Labs    12/31/18 0200 01/01/19 0230  FERRITIN 961* 818*   Urinalysis    Component Value Date/Time   COLORURINE YELLOW (A) 12/27/2018 1509   APPEARANCEUR CLEAR (A) 12/27/2018 1509   LABSPEC 1.009 12/27/2018 1509   PHURINE 7.0 12/27/2018 1509   GLUCOSEU >=500 (A) 12/27/2018 1509   HGBUR NEGATIVE 12/27/2018 1509   BILIRUBINUR NEGATIVE 12/27/2018 1509   KETONESUR 20 (A) 12/27/2018 1509   PROTEINUR 30 (A) 12/27/2018 1509   NITRITE NEGATIVE 12/27/2018 1509   LEUKOCYTESUR TRACE (A) 12/27/2018 1509   Sepsis Labs Invalid input(s): PROCALCITONIN,  WBC,  LACTICIDVEN Microbiology Recent Results (from the past 240 hour(s))  SARS Coronavirus 2 (CEPHEID- Performed in [facility] hospital lab), Hosp Order     Status: Abnormal   Collection Time: 12/23/18  2:05 PM   Specimen: Nasopharyngeal Swab  Result Value Ref Range Status   SARS Coronavirus 2 POSITIVE (A) NEGATIVE Final    Comment: RESULT CALLED TO, READ BACK BY AND VERIFIED WITH: AMY COYEN @1508  12/23/18 MJU (NOTE) If result is NEGATIVE SARS-CoV-2 target nucleic acids are NOT DETECTED. The SARS-CoV-2 RNA is generally detectable in upper and lower  respiratory specimens during the acute phase of infection. The lowest  concentration of SARS-CoV-2 viral copies this assay can detect is 250  copies / mL. A negative result does not preclude SARS-CoV-2 infection  and should not be used as the sole basis for treatment or other  patient management decisions.  A negative result may occur with  improper specimen collection / handling, submission of specimen other  than nasopharyngeal swab, presence of viral mutation(s) within the  areas  targeted by this assay, and inadequate number of viral copies  (<250 copies / mL). A negative result must be combined with clinical  observations, patient history, and epidemiological information. If result is POSITIVE SARS-CoV-2 target nucleic acids are DETECTED. The SARS-C oV-2 RNA is generally detectable in upper and lower  respiratory specimens during the acute phase of infection.  Positive  results are indicative of active infection with SARS-CoV-2.  Clinical  correlation with patient history and other diagnostic information is  necessary to determine patient infection status.  Positive results do  not rule out bacterial infection or co-infection with other viruses. If result is PRESUMPTIVE POSTIVE SARS-CoV-2 nucleic acids MAY BE PRESENT.   A presumptive positive result was obtained on the submitted specimen  and confirmed on repeat testing.  While 2019 novel coronavirus  (SARS-CoV-2) nucleic acids may be present in the submitted sample  additional confirmatory testing may be necessary for epidemiological  and / or clinical management purposes  to differentiate between  SARS-CoV-2 and other Sarbecovirus currently known to infect humans.  If clinically indicated additional testing with an alternate test  methodology [phone] is advised.  The SARS-CoV-2 RNA is generally  detectable in upper and lower respiratory specimens during the acute  phase of infection. The expected result is Negative. Fact Sheet for Patients:  StrictlyIdeas.no Fact Sheet for Healthcare Providers: BankingDealers.co.za This test is not yet approved or cleared by the [place] FDA and has been authorized for detection and/or diagnosis of SARS-CoV-2 by FDA under an Emergency Use Authorization (EUA).  This EUA will remain in effect (meaning this test can be used) for the duration of the COVID-19 declaration under Section 564(b)(1) of the Act, 21 U.S.C. section  360bbb-3(b)(1), unless the authorization is terminated or revoked sooner. Performed at [facility], [address] [place], [place] [address]   Culture, blood (routine x 2)     Status: None   Collection Time: 12/27/18  8:56 AM   Specimen: BLOOD  Result Value Ref Range Status   Specimen Description BLOOD RAC  Final   Special Requests   Final    BOTTLES DRAWN AEROBIC AND ANAEROBIC Blood Culture adequate volume   Culture   Final    NO GROWTH 5 DAYS Performed at [facility], [address] [place], [place] [address]    Report Status 01/01/2019 FINAL  Final  Culture, blood (routine x 2)     Status: None   Collection Time: 12/27/18  8:56 AM   Specimen: BLOOD  Result Value Ref Range Status   Specimen Description BLOOD LT WRIST  Final   Special Requests   Final    BOTTLES DRAWN AEROBIC AND ANAEROBIC Blood Culture adequate volume   Culture   Final    NO GROWTH 5 DAYS Performed at [facility], [address], [place], [place] [address]    Report Status 01/01/2019 FINAL  Final  Urine culture     Status: Abnormal   Collection Time: 12/27/18  3:09 PM   Specimen: Urine, Random  Result Value Ref Range Status   Specimen Description   Final    URINE, RANDOM Performed at [facility], [address] [place], [place] [address]    Special Requests   Final    NONE Performed at [facility], [address], [place], [place] [address]    Culture (A)  Final    <10,000 COLONIES/mL INSIGNIFICANT GROWTH Performed at [facility], [place] [address] [place],  [address]    Report Status 12/29/2018 FINAL  Final     Time coordinating discharge: 40 minutes      SIGNED:   Edwin Dada, MD  [facility] 01/02/2019, 12:03 PM

## 2019-01-15 ENCOUNTER — Other Ambulatory Visit: Payer: Self-pay

## 2019-01-15 ENCOUNTER — Ambulatory Visit: Payer: HRSA Program | Attending: Critical Care Medicine | Admitting: Critical Care Medicine

## 2019-01-15 ENCOUNTER — Encounter: Payer: Self-pay | Admitting: Critical Care Medicine

## 2019-01-15 DIAGNOSIS — E1165 Type 2 diabetes mellitus with hyperglycemia: Secondary | ICD-10-CM | POA: Diagnosis not present

## 2019-01-15 DIAGNOSIS — U071 COVID-19: Secondary | ICD-10-CM

## 2019-01-15 DIAGNOSIS — E119 Type 2 diabetes mellitus without complications: Secondary | ICD-10-CM

## 2019-01-15 DIAGNOSIS — J9601 Acute respiratory failure with hypoxia: Secondary | ICD-10-CM | POA: Diagnosis not present

## 2019-01-15 DIAGNOSIS — E871 Hypo-osmolality and hyponatremia: Secondary | ICD-10-CM

## 2019-01-15 DIAGNOSIS — IMO0002 Reserved for concepts with insufficient information to code with codable children: Secondary | ICD-10-CM

## 2019-01-15 DIAGNOSIS — J069 Acute upper respiratory infection, unspecified: Secondary | ICD-10-CM

## 2019-01-15 NOTE — Progress Notes (Signed)
covid 19 follow up  Per pt when ever he feels like sneezing he feels like there's pressure on his chest  CBG this morning was 140

## 2019-01-15 NOTE — Progress Notes (Signed)
Patient ID: Chad BeltonGregorio Mcclain, male   DOB: 1966/05/10, 53 y.o.   MRN: [id number] Virtual Visit via Telephone Note  I connected with Chad Mcclain on 01/15/19 at  9:30 AM EDT by telephone and verified that I am speaking with the correct person using two identifiers.   Consent:  I discussed the limitations, risks, security and privacy concerns of performing an evaluation and management service by telephone and the availability of in person appointments. I also discussed with the patient that there may be a patient responsible charge related to this service. The patient expressed understanding and agreed to proceed.  Location of patient: The patient lives at home  Location of provider: in my office  Persons participating in the televisit with the patient.   Spanish speaking interpreter Sydnee CabalDiallo was on the call [phone] No other family members were on the call    History of Present Illness: This is a 53 year old Latino male who was admitted between the 10th and 16 July for acute respiratory failure secondary to COVID-19 virus.  The patient had been ill since 1 July and initially went to the ER on 6 July and discharged home for home support.  The patient worsened and returned on 10 July with acute hypoxemia and saturations in the 60% range on room air and thus was [facility].  Below is the discharge summary from that admission Admit date: 12/27/2018 Discharge date: 01/02/2019  Admitted From: Home  Disposition:  Home   Recommendations for Outpatient Follow-up:  1. Follow up with PCP in 1-2 weeks 2. Please obtain BMP/CBC in one week 3. Please obtain HgbA1c in 3 months 4. Please repeat LFTs in 1-3 months   Home Health: None  Equipment/Devices: None  Discharge Condition: Good  CODE STATUS: FULL Diet recommendation: Diabetic  Brief/Interim Summary: Chad Mcclain is a 53 y.o. M with no significant PMHx who presnented with SOB, found to have  COVID-19.   Chad Mcclain is a 53 y.o. male with no known past medical history who presented with complains of shortness of breath and cough.  Initially presented to the emergency department at [facility] on 7/6 complaining of fever, cough, shortness of breath ongoing for many days.  Patient was tested for COVID-19 and was found to be positive.  However at that time patient was not hypoxic.  He was discharged home.  He presented back to the emergency department this morning with complaints of worsening shortness of breath.  Denies orthopnea or symptoms suggestive of PND.  Patient mentions a cough with whitish expectoration.  Denies any chest pain.  No nausea vomiting.  No diarrhea.  He has had body aches.  No known sick contacts.    In the emergency department he was noted to be hypoxic saturating in the 60s.  Oxygen was immediately applied.  He improved.  He was given steroids.  However patient continued to have oxygen requirements and felt short of breath.  He was changed over to 100% nonrebreather.  Chest x-ray showed bilateral opacities.  Patient was hospitalized for further management.    PRINCIPAL HOSPITAL DIAGNOSIS: COVID-19    Discharge Diagnoses:  COVID-19 with acute hypoxic respiratory failure Treated with steroids, remdesivir, Actemra and prophylactic Lovenox.  Weaned from O2.     Diabetes Possibly newly diagnosed, although patient describes being told in past with "an insulin test" that he nearly had diabetes, and that he had to modify his diet but not take any medicines.  He also, however,  describes checking his AM sugar, being anywhere between 130 and 180 mg/dL, fasting.    A1c here was >10%.    Treated here while on steroids with Lantus 15 units and SSI with good results.  Discussed metformin vs Lantus at discharge, and in shared decision making, patient elected for metformin.  He has close PCP follow up.    Transaminitis From COVID.  Needs repeat LFTs.   CBG  elevated.  A1C 11.0 Never dx before.      Since discharge the patient has had a full recovery.  He states he still has some fatigue however he no longer has shortness of breath, cough, fever, muscle aches, he has full recovery of his sense of taste and smell.  Note during the hospitalization he was found to have type 2 diabetes with hemoglobin A1c of 11 blood sugars in the 233 100 range.  He was sent home on metformin 5 mg twice daily and since discharge his blood sugars have been fasting in the 120 range and postprandial in the 1 40-1 60 range he has been following a diabetic diet.  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  He is out of isolation as of July 24.  The patient is seeking a primary care provider.  The patient is taking the aspirin 81 mg daily along with Lipitor 40 mg daily and the metformin daily   Constitutional:   No  weight loss, night sweats,  Fevers, chills, fatigue, lassitude. HEENT:   No headaches,  Difficulty swallowing,  Tooth/dental problems,  Sore throat,                No sneezing, itching, ear ache, nasal congestion, post nasal drip,   CV:  No chest pain,  Orthopnea, PND, swelling in lower extremities, anasarca, dizziness, palpitations  GI  No heartburn, indigestion, abdominal pain, nausea, vomiting, diarrhea, change in bowel habits, loss of appetite  Resp: No shortness of breath with exertion or at rest.  No excess mucus, no productive cough,  No non-productive cough,  No coughing up of blood.  No change in color of mucus.  No wheezing.  No chest wall deformity  Skin: no rash or lesions.  GU: no dysuria, change in color of urine, no urgency or frequency.  No flank pain.  MS:  No joint pain or swelling.  No decreased range of motion.  No back pain.  Psych:  No change in mood or affect. No depression or anxiety.  No memory loss.  Observations/Objective: No observations as this is a telephone  call  Assessment and Plan: #1 COVID-19 viral infection with acute respiratory failure and viral pneumonia now resolved  The patient now is out of isolation and does not require any additional support for this condition and is now able to come into the office for an in office exam  #2 type 2 diabetes improved control on metformin  Will continue metformin for now plan follow-up labs in he comes in the office for office exam  #3 hyperlipidemia secondary diabetes  Will continue atorvastatin for now and recheck lipids when the patient comes to the office   Follow Up Instructions: The patient knows there will be an in office exam scheduled week of August 17   I discussed the assessment and treatment plan with the patient. The patient was provided an opportunity to ask questions and all were answered. The patient agreed with the plan  and demonstrated an understanding of the instructions.   The patient was advised to call back or seek an in-person evaluation if the symptoms worsen or if the condition fails to improve as anticipated.  I provided 30 minutes of non-face-to-face time during this encounter  including  median intraservice time , review of notes, labs, imaging, medications  and explaining diagnosis and management to the patient .    Shan LevansPatrick Amour Trigg, MD

## 2019-02-02 NOTE — Progress Notes (Deleted)
Subjective:    Patient ID: Chad Mcclain, male    DOB: 05/21/1966, 53 y.o.   MRN: [id number]  This is a 53 year old Latino male who was admitted between the 10th and 16 July for acute respiratory failure secondary to COVID-19 virus.  The patient had been ill since 1 July and initially went to the ER on 6 July and discharged home for home support.  The patient worsened and returned on 10 July with acute hypoxemia and saturations in the 60% range on room air and thus was admitted to the [facility].  Below is the discharge summary from that admission Admit date:12/27/2018 Discharge date:01/02/2019  Admitted From:Home Disposition:Home  Recommendations for Outpatient Follow-up: 1. Follow up with PCP in 1-2 weeks 2. Please obtain BMP/CBC in one week 3. Please obtain HgbA1c in 3 months 4. Please repeat LFTs in 1-3 months   Home Health:None Equipment/Devices:None  Discharge Condition:Good CODE STATUS:FULL Diet recommendation:Diabetic  Brief/Interim Summary: Mr. Chad Mcclain is a 53 y.o. M with no significant PMHx who presnented with SOB, found to have COVID-19. Chad Mcclain a 53 y.o.malewith no known past medical history who presented with complains of shortness of breath and cough. Initially presented to the emergency department at [facility] on 7/6 complaining of fever,cough,shortness of breath ongoing for many days. Patient was tested for COVID-19 and was found to be positive. However at that time patient was not hypoxic. He was discharged home. He presented back to the emergency department this morning with complaints of worsening shortness of breath. Denies orthopnea or symptoms suggestive of PND. Patient mentions a cough with whitish expectoration. Denies any chest pain. No nausea vomiting. No diarrhea. He has had body aches. No known sick contacts.   In the emergency department he was noted to be hypoxic saturating in  the 60s. Oxygen was immediately applied. He improved. He was given steroids. However patient continued to have oxygen requirements and felt short of breath. He was changed over to 100% nonrebreather. Chest x-ray showed bilateral opacities. Patient was hospitalized for further management.    PRINCIPAL HOSPITAL DIAGNOSIS: COVID-19    Discharge Diagnoses: COVID-19 with acute hypoxic respiratory failure Treated with steroids, remdesivir, Actemra and prophylactic Lovenox. Weaned from O2.    Diabetes Possibly newly diagnosed, although patient describes being told in past with "an insulin test" that he nearly had diabetes, and that he had to modify his diet but not take any medicines. He also, however, describes checking his AM sugar, being anywhere between 130 and 180 mg/dL, fasting.  A1c here was >10%.   Treated here while on steroids with Lantus 15 units and SSI with good results.Discussed metformin vs Lantus at discharge, and in shared decision making, patient elected for metformin. He has close PCP follow up.   Transaminitis From COVID.Needs repeat LFTs.   CBG elevated.  A1C 11.0 Never dx before.      Since discharge the patient has had a full recovery.  He states he still has some fatigue however he no longer has shortness of breath, cough, fever, muscle aches, he has full recovery of his sense of taste and smell.  Note during the hospitalization he was found to have type 2 diabetes with hemoglobin A1c of 11 blood sugars in the 233 100 range.  He was sent home on metformin 5 mg twice daily and since discharge his blood sugars have been fasting in the 120 range and postprandial in the 1 40-1 60 range he has been following a  diabetic diet.  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  He is out of isolation as of July 24.  The patient is seeking a primary care provider.  The patient is taking  the aspirin 81 mg daily along with Lipitor 40 mg daily and the metformin daily  1 COVID-19 viral infection with acute respiratory failure and viral pneumonia now resolved  The patient now is out of isolation and does not require any additional support for this condition and is now able to come into the office for an in office exam  #2 type 2 diabetes improved control on metformin             Will continue metformin for now plan follow-up labs in he comes in the office for office exam  #3 hyperlipidemia secondary diabetes             Will continue atorvastatin for now and recheck lipids when the patient comes to the office     Review of Systems     Objective:   Physical Exam        Assessment & Plan:

## 2019-02-03 ENCOUNTER — Ambulatory Visit: Payer: Self-pay | Admitting: Critical Care Medicine

## 2021-06-17 IMAGING — DX PORTABLE CHEST - 1 VIEW
1 series · 1 of 1 positions shown · non-contrast
Comparison: None.

CLINICAL DATA: Fever cough and shortness of breath for 7 days.

EXAM:
PORTABLE CHEST 1 VIEW

[chest ap]
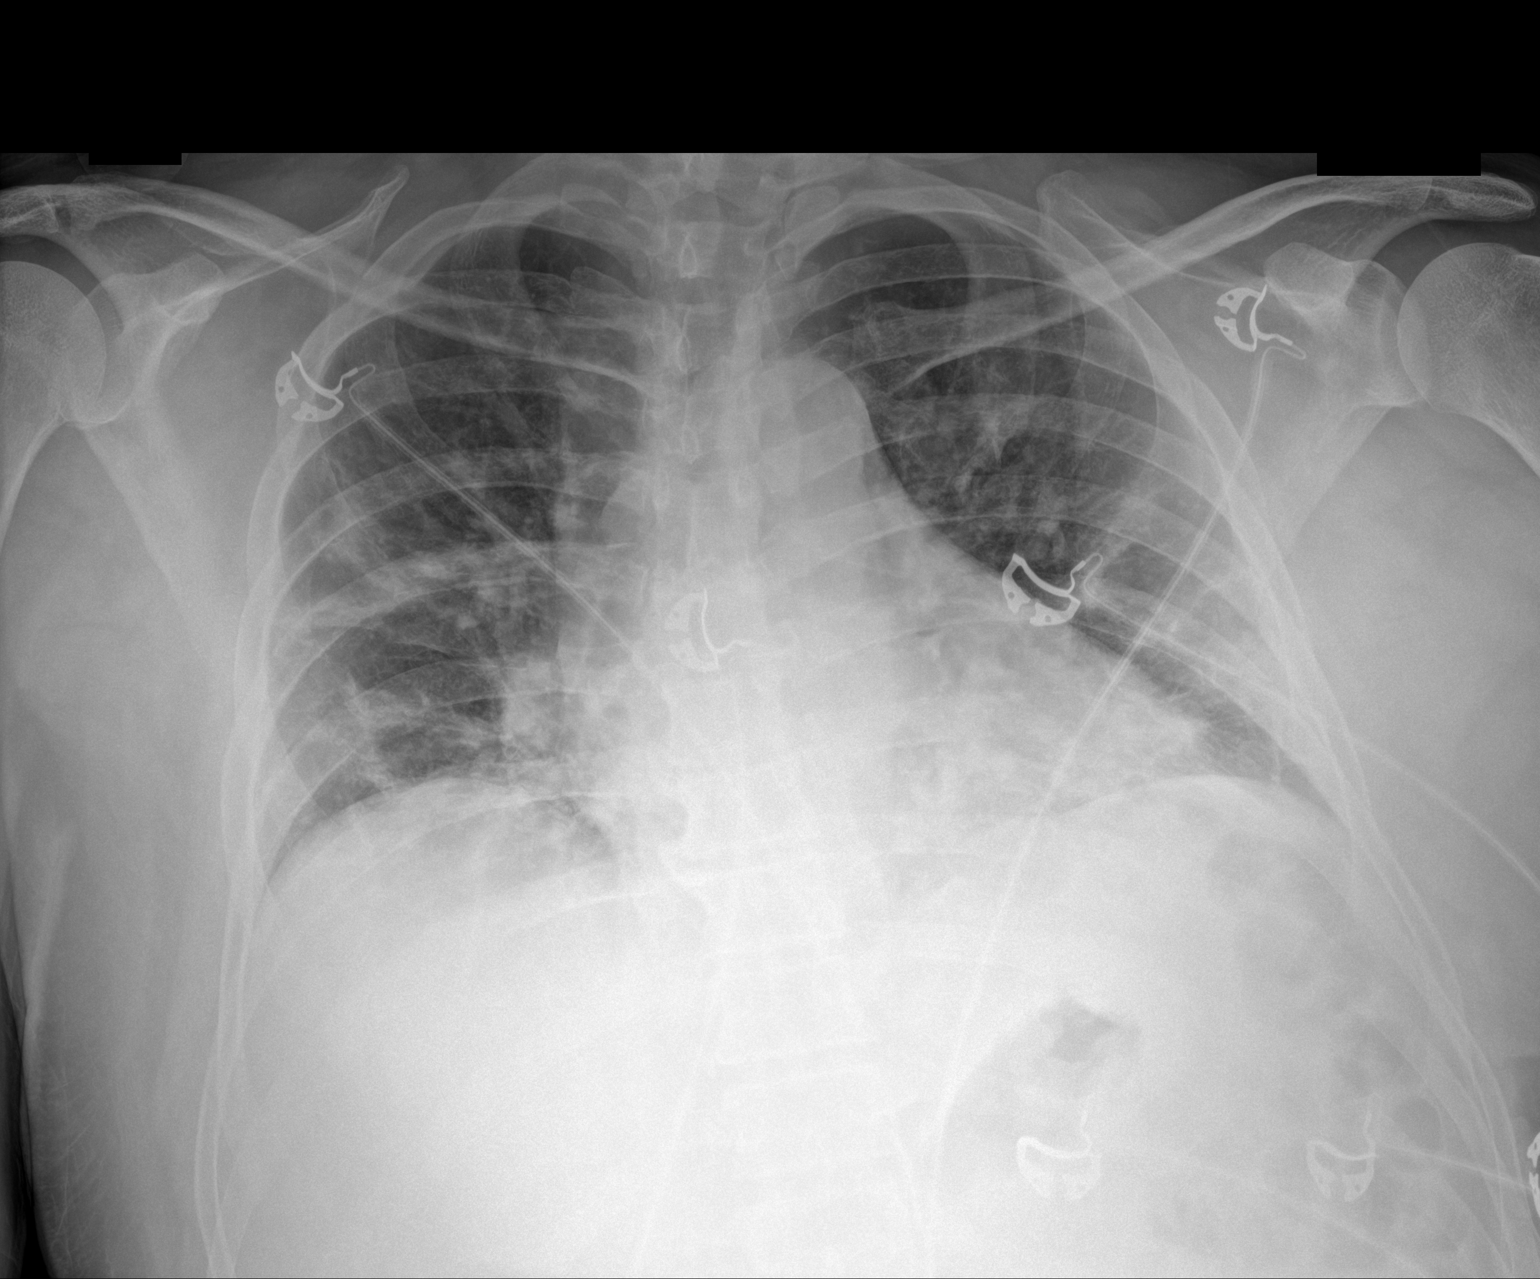

[1 of 1 positions shown; findings below may reference images not displayed]

FINDINGS: Cardiomediastinal silhouette is normal. Mediastinal contours appear
intact.

There are diffuse streaky bilateral airspace opacities with lower
lobe predominance.

Osseous structures are without acute abnormality. Soft tissues are
grossly normal.
IMPRESSION: Diffuse streaky bilateral airspace opacities with lower lobe
predominance likely represents multifocal pneumonia, including viral
pneumonia.

## 2021-06-21 IMAGING — DX PORTABLE CHEST - 1 VIEW
1 series · 1 of 1 positions shown · non-contrast
Comparison: 12/23/2018

CLINICAL DATA: PYHAW-74 positive.  Shortness of breath.

EXAM:
PORTABLE CHEST 1 VIEW

[chest ap]
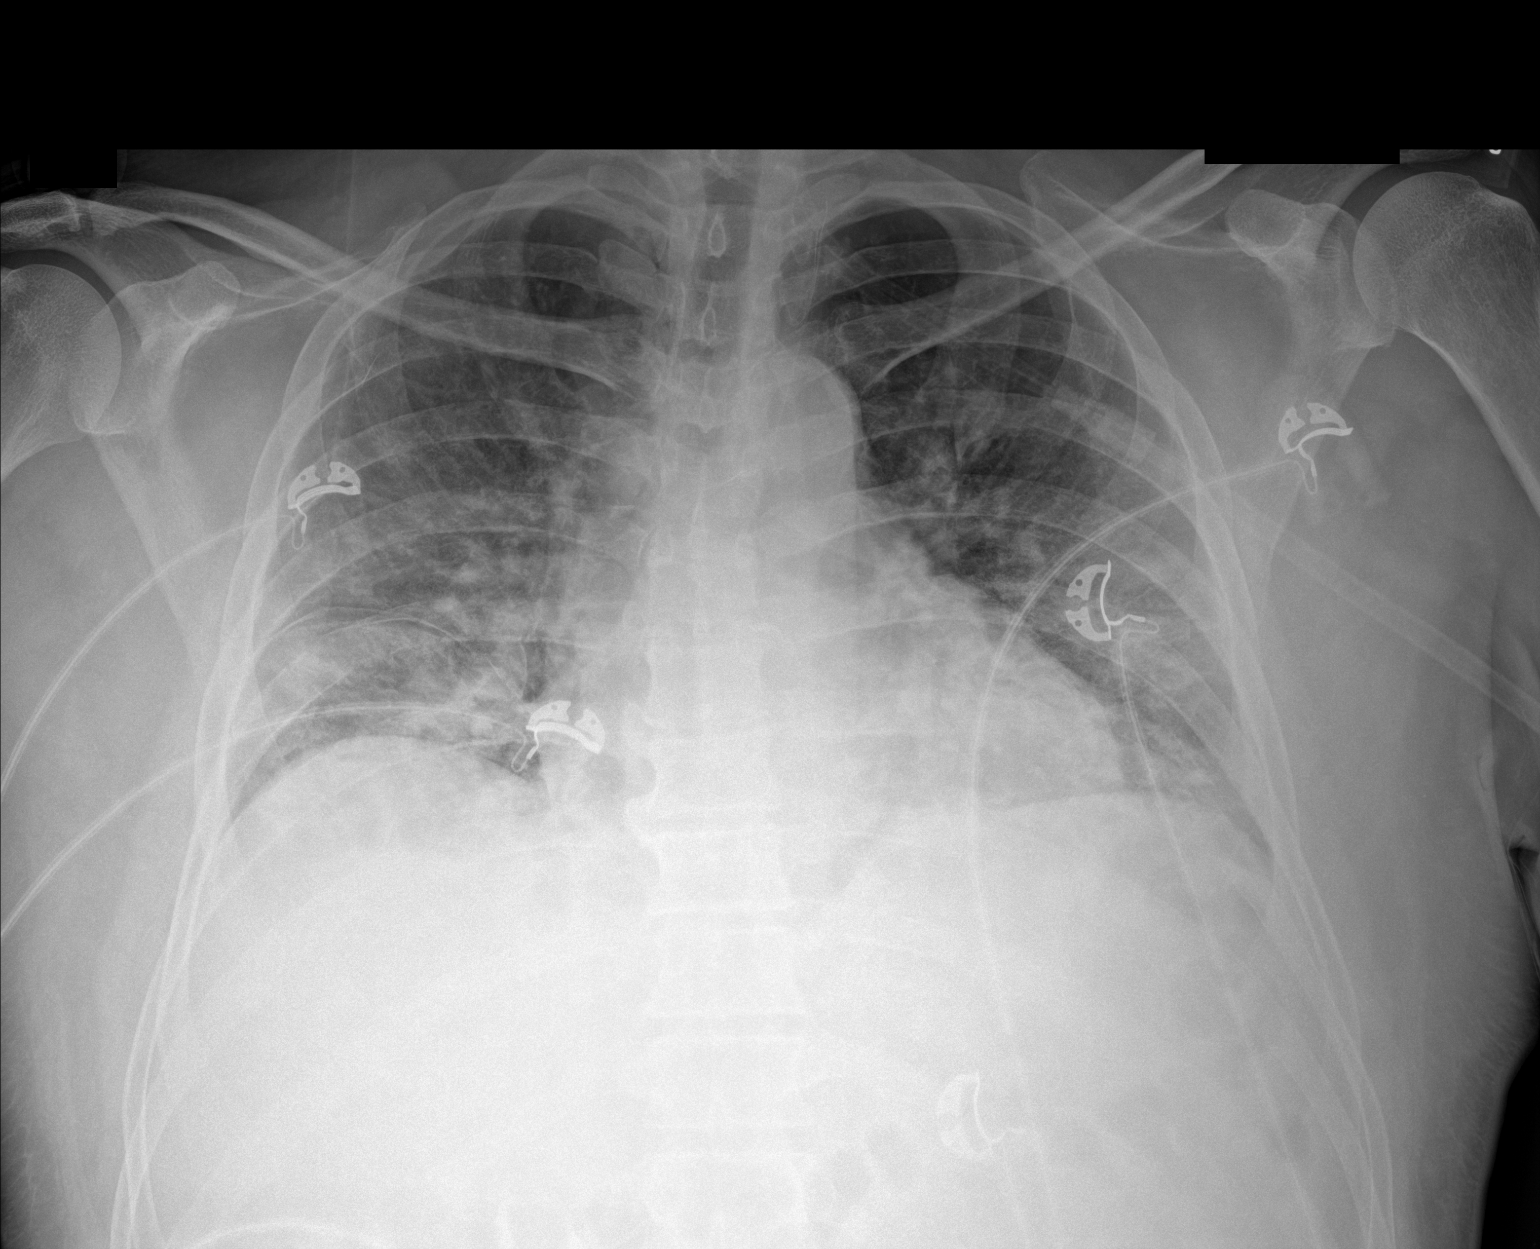

[1 of 1 positions shown; findings below may reference images not displayed]

FINDINGS: Again noted are low lung volumes. Persistent streaky lung densities
with slight progression, particularly at the right lung base. Heart
size is stable and within normal limits. The trachea is midline.
Negative for pneumothorax.
IMPRESSION: Mild progression of the bilateral lung densities particularly at the
right lung base. Findings are suggestive for bilateral pneumonia.

## 2022-02-08 ENCOUNTER — Emergency Department
Admission: EM | Admit: 2022-02-08 | Discharge: 2022-02-08 | Disposition: A | Discharge status: Home / Self Care | Payer: Self-pay | Attending: Emergency Medicine | Admitting: Emergency Medicine

## 2022-02-08 ENCOUNTER — Other Ambulatory Visit: Payer: Self-pay

## 2022-02-08 DIAGNOSIS — S0502XA Injury of conjunctiva and corneal abrasion without foreign body, left eye, initial encounter: Secondary | ICD-10-CM | POA: Insufficient documentation

## 2022-02-08 DIAGNOSIS — W208XXA Other cause of strike by thrown, projected or falling object, initial encounter: Secondary | ICD-10-CM | POA: Insufficient documentation

## 2022-02-08 MED ORDER — TETRACAINE HCL 0.5 % OP SOLN
2.0000 [drp] | Freq: Once | OPHTHALMIC | Status: DC
  Filled 2022-02-08: qty 4

## 2022-02-08 MED ORDER — FLUORESCEIN SODIUM 1 MG OP STRP
1.0000 | ORAL_STRIP | Freq: Once | OPHTHALMIC | Status: DC
  Filled 2022-02-08: qty 1

## 2022-02-08 MED ORDER — POLYMYXIN B-TRIMETHOPRIM 10000-0.1 UNIT/ML-% OP SOLN: 1.0000 [drp] | OPHTHALMIC | 0 refills | Status: AC

## 2022-02-08 MED ORDER — KETOROLAC TROMETHAMINE 0.5 % OP SOLN: 1.0000 [drp] | Freq: Four times a day (QID) | OPHTHALMIC | 0 refills | Status: AC

## 2022-02-08 NOTE — ED Provider Notes (Signed)
[facility] Provider Note  Patient Contact: 7:44 PM (approximate)   History   Eye Pain   HPI  Chad Mcclain is a 56 y.o. male who presents the emergency department complaining of an eye injury to left eye.  Patient was mowing when a rock kicked out from underneath the mower, hit what appears to be a tree imbalance back into his eye.  Patient has had some redness along the medial eye.  He endorses some pain and twitching sensation in the eye.  No periorbital pain.  No open wounds.  Patient did not wear glasses or contacts     Physical Exam   Triage Vital Signs: ED Triage Vitals [02/08/22 1810]  Enc Vitals Group     BP (!) 146/90     Pulse Rate 78     Resp 16     Temp 98 F (36.7 C)     Temp Source Oral     SpO2 92 %     Weight 190 lb (86.2 kg)     Height      Head Circumference      Peak Flow      Pain Score 4     Pain Loc      Pain Edu?      Excl. in GC?     Most recent vital signs: Vitals:   02/08/22 1810  BP: (!) 146/90  Pulse: 78  Resp: 16  Temp: 98 F (36.7 C)  SpO2: 92%     General: Alert and in no acute distress. Eyes:  PERRL. EOMI. patient has subconjunctival hemorrhage to the medial left eye.  Funduscopic exam reveals red reflex, vasculature and optic disc bilaterally.  No evidence of hyphema.  Patient with tetracaine drops applied for anesthesia to the left eye.  Fluorescein staining applied with area of uptake along the medial aspect consistent with corneal abrasion.  No evidence of retained foreign body.  Cardiovascular:  Good peripheral perfusion Respiratory: Normal respiratory effort without tachypnea or retractions. Lungs CTAB.  Musculoskeletal: Full range of motion to all extremities.  Neurologic:  No gross focal neurologic deficits are appreciated.  Skin:   No rash noted Other:   ED Results / Procedures / Treatments   Labs (all labs ordered are listed, but only abnormal results are displayed) Labs Reviewed -  No data to display   EKG     RADIOLOGY    No results found.  PROCEDURES:  Critical Care performed: No  Procedures   MEDICATIONS ORDERED IN ED: Medications  tetracaine (PONTOCAINE) 0.5 % ophthalmic solution 2 drop (has no administration in time range)  fluorescein ophthalmic strip 1 strip (has no administration in time range)     IMPRESSION / MDM / ASSESSMENT AND PLAN / ED COURSE  I reviewed the triage vital signs and the nursing notes.                              Differential diagnosis includes, but is not limited to, corneal abrasion, hyphema, retained foreign body.     Patient's presentation is most consistent with acute presentation with potential threat to life or bodily function.   Patient's diagnosis is consistent with corneal abrasion.  Patient presented to the ED complaining of left eye pain after a rock flew up from the mid lumbar struck in the eye.  Vision is preserved.  Ocular motion intact.  No evidence of hyphema or globe rupture.  Patient had corneal abrasion along the medial aspect.  To be placed on antibiotic eyedrops as well as Acular for symptom relief.  Follow-up with ophthalmologist as needed..  Patient is given ED precautions to return to the ED for any worsening or new symptoms.        FINAL CLINICAL IMPRESSION(S) / ED DIAGNOSES   Final diagnoses:  Abrasion of left cornea, initial encounter     Rx / DC Orders   ED Discharge Orders          Ordered    ketorolac (ACULAR) 0.5 % ophthalmic solution  4 times daily        02/08/22 2038    trimethoprim-polymyxin b (POLYTRIM) ophthalmic solution  Every 4 hours        02/08/22 2038             Note:  This document was prepared using Dragon voice recognition software and may include unintentional dictation errors.   Lanette Hampshire 02/08/22 2038    Phineas Semen, MD 02/08/22 2142

## 2022-02-08 NOTE — ED Notes (Signed)
Leye 20/50 Reye 20/25

## 2022-02-08 NOTE — ED Triage Notes (Signed)
Pt coming POV for L eye pain. Pt stating that yesterday they were cutting the lawn and a rock flew out from the mower and hit them in my eye. Pt endorsing twitching in L eye since accident.  Pt stating vision in tact

## 2023-01-26 ENCOUNTER — Emergency Department
Admission: EM | Admit: 2023-01-26 | Discharge: 2023-01-26 | Disposition: A | Discharge status: Home / Self Care | Payer: Self-pay | Attending: Student in an Organized Health Care Education/Training Program | Admitting: Student in an Organized Health Care Education/Training Program

## 2023-01-26 ENCOUNTER — Emergency Department: Payer: Self-pay

## 2023-01-26 ENCOUNTER — Other Ambulatory Visit: Payer: Self-pay

## 2023-01-26 DIAGNOSIS — R0789 Other chest pain: Secondary | ICD-10-CM | POA: Insufficient documentation

## 2023-01-26 DIAGNOSIS — R1013 Epigastric pain: Secondary | ICD-10-CM | POA: Insufficient documentation

## 2023-01-26 LAB — CBC
HCT: 47 % (ref 39.0–52.0)
Hemoglobin: 16.4 g/dL (ref 13.0–17.0)
MCH: 31.2 pg (ref 26.0–34.0)
MCHC: 34.9 g/dL (ref 30.0–36.0)
MCV: 89.4 fL (ref 80.0–100.0)
Platelets: 193 10*3/uL (ref 150–400)
RBC: 5.26 MIL/uL (ref 4.22–5.81)
RDW: 12.2 % (ref 11.5–15.5)
WBC: 8.4 10*3/uL (ref 4.0–10.5)
nRBC: 0 % (ref 0.0–0.2)

## 2023-01-26 LAB — BASIC METABOLIC PANEL
Anion gap: 9 (ref 5–15)
BUN: 21 mg/dL — ABNORMAL HIGH (ref 6–20)
CO2: 24 mmol/L (ref 22–32)
Calcium: 8.8 mg/dL — ABNORMAL LOW (ref 8.9–10.3)
Chloride: 103 mmol/L (ref 98–111)
Creatinine, Ser: 1.04 mg/dL (ref 0.61–1.24)
GFR, Estimated: >60 mL/min (ref >60)
Glucose, Bld: 279 mg/dL — ABNORMAL HIGH (ref 70–99)
Potassium: 3.9 mmol/L (ref 3.5–5.1)
Sodium: 136 mmol/L (ref 135–145)

## 2023-01-26 LAB — TROPONIN I (HIGH SENSITIVITY)
Troponin I (High Sensitivity): 3 ng/L (ref <18)
Troponin I (High Sensitivity): 3 ng/L (ref <18)

## 2023-01-26 MED ORDER — IOHEXOL 350 MG/ML SOLN
100.0000 mL | Freq: Once | INTRAVENOUS | Status: AC | PRN
  Administered 2023-01-26: 100 mL via INTRAVENOUS

## 2023-01-26 NOTE — ED Notes (Addendum)
Blue top sent to lab. 

## 2023-01-26 NOTE — ED Provider Notes (Signed)
[facility] Provider Note    Event Date/Time   First MD Initiated Contact with Patient 01/26/23 2134     (approximate)   History   Chest Pain   HPI  Chad Mcclain is a 57 y.o. 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  Did have some associated epigastric discomfort denies any abdominal pain right now.  Symptoms lasted roughly 20 minutes.  Does not drink alcohol does not smoke.  Has never had pain like this before.     Physical Exam   Triage Vital Signs: ED Triage Vitals  Encounter Vitals Group     BP 01/26/23 1925 (!) 152/95     Systolic BP Percentile --      Diastolic BP Percentile --      Pulse Rate 01/26/23 1925 81     Resp 01/26/23 1925 18     Temp 01/26/23 1925 98.6 F (37 C)     Temp Source 01/26/23 1925 Oral     SpO2 01/26/23 1925 98 %     Weight --      Height --      Head Circumference --      Peak Flow --      Pain Score 01/26/23 1924 5     Pain Loc --      Pain Education --      Exclude from Growth Chart --     Most recent vital signs: Vitals:   01/26/23 1925 01/26/23 2122  BP: (!) 152/95 (!) 166/104  Pulse: 81 64  Resp: 18 16  Temp: 98.6 F (37 C)   SpO2: 98% 99%     Constitutional: Alert  Eyes: Conjunctivae are normal.  Head: Atraumatic. Nose: No congestion/rhinnorhea. Mouth/Throat: Mucous membranes are moist.   Neck: Painless ROM.  Cardiovascular:   Good peripheral circulation. Respiratory: Normal respiratory effort.  No retractions.  Gastrointestinal: Soft and nontender.  Musculoskeletal:  no deformity Neurologic:  MAE spontaneously. No gross focal neurologic deficits are appreciated.  Skin:  Skin is warm, dry and intact. No rash noted. Psychiatric: Mood and affect are normal. Speech and behavior are normal.    ED Results / Procedures / Treatments   Labs (all labs ordered are  listed, but only abnormal results are displayed) Labs Reviewed  BASIC METABOLIC PANEL - Abnormal; Notable for the following components:      Result Value   Glucose, Bld 279 (*)    BUN 21 (*)    Calcium 8.8 (*)    All other components within normal limits  CBC  TROPONIN I (HIGH SENSITIVITY)  TROPONIN I (HIGH SENSITIVITY)     EKG  ED ECG REPORT I, Willy Eddy, the attending physician, personally viewed and interpreted this ECG.   Date: 01/26/2023  EKG Time: 19:22  Rate: 75  Rhythm: sinus  Axis: normal  Intervals: normal  ST&T Change: no stemi, no depressions    RADIOLOGY Please see ED Course for my review and interpretation.  I personally reviewed all radiographic images ordered to evaluate for the above acute complaints and reviewed radiology reports and findings.  These findings were personally discussed with the patient.  Please see medical record for radiology report.    PROCEDURES:  Critical Care performed:   Procedures   MEDICATIONS ORDERED IN ED: Medications  iohexol (OMNIPAQUE) 350 MG/ML injection 100 mL (100 mLs Intravenous Contrast Given  01/26/23 2231)     IMPRESSION / MDM / ASSESSMENT AND PLAN / ED COURSE  I reviewed the triage vital signs and the nursing notes.                              Differential diagnosis includes, but is not limited to, ACS, pericarditis, esophagitis, boerhaaves, pe, dissection, pna, bronchitis, costochondritis  Patient presenting to the ER for evaluation of symptoms as described above.  Based on symptoms, risk factors and considered above differential, this presenting complaint could reflect a potentially life-threatening illness therefore the patient will be placed on continuous pulse oximetry and telemetry for monitoring.  Laboratory evaluation will be sent to evaluate for the above complaints. Patient well appearing now rating pain as 0.  EKG is non ischemic.  Trop negative.  Patient appears well and in no acute  distress.  As he does report pain going through to his back over CTA.  Abdominal exam soft and benign.   Clinical Course as of 01/26/23 2324  Fri Jan 26, 2023  2250 CT imaging on my review and interpretation without evidence of dissection or aneurysm.  Repeat troponin is negative. [PR]    Clinical Course User Index [PR] Willy Eddy, MD  Patient asymptomatic.  At this point does appear stable and appropriate for outpatient follow-up.  His PCP will give referral to cardiology given age and risk factors.  Discussed return precautions.  Patient agreeable with plan.   FINAL CLINICAL IMPRESSION(S) / ED DIAGNOSES   Final diagnoses:  Atypical chest pain     Rx / DC Orders   ED Discharge Orders          Ordered    Ambulatory referral to Cardiology       Comments: If you have not heard from the Cardiology office within the next 72 hours please call [phone].   01/26/23 2319             Note:  This document was prepared using Dragon voice recognition software and may include unintentional dictation errors.    Willy Eddy, MD 01/26/23 [phone]

## 2023-01-26 NOTE — ED Triage Notes (Signed)
Pt to ED via POV c/o central chest pain that started around 4pm today. Pain in nonradiating chest pressure. Pain has not gone away. Pt with associated dizziness and nausea. Denies SOB, vomiting, diarrhea, fevers. Pt diaphoretic from work. Pt A&Ox4, NAD at this time
